# Patient Record
Sex: Male | Born: 1965 | Race: White | Hispanic: No | Marital: Married | Smoking: Former smoker
Health system: Southern US, Community
[De-identification: ages and names within clinical notes are randomized; demographics above are authoritative.]

## PROBLEM LIST (undated history)

## (undated) DIAGNOSIS — K219 Gastro-esophageal reflux disease without esophagitis: Secondary | ICD-10-CM

## (undated) DIAGNOSIS — I10 Essential (primary) hypertension: Secondary | ICD-10-CM

## (undated) HISTORY — PX: APPENDECTOMY: SHX54

---

## 2000-05-23 HISTORY — PX: HERNIA REPAIR: SHX51

## 2008-10-24 DIAGNOSIS — R040 Epistaxis: Secondary | ICD-10-CM | POA: Insufficient documentation

## 2011-07-05 LAB — BASIC METABOLIC PANEL
BUN: 18 mg/dL (ref 4–21)
Creatinine: 1.1 mg/dL (ref 0.6–1.3)
GLUCOSE: 89 mg/dL
POTASSIUM: 4.6 mmol/L (ref 3.4–5.3)
SODIUM: 139 mmol/L (ref 137–147)

## 2011-07-05 LAB — LIPID PANEL
Cholesterol: 168 mg/dL (ref 0–200)
HDL: 45 mg/dL (ref 35–70)
LDL Cholesterol: 98 mg/dL
TRIGLYCERIDES: 127 mg/dL (ref 40–160)

## 2011-07-05 LAB — TSH: TSH: 3.54 u[IU]/mL (ref 0.41–5.90)

## 2011-08-10 HISTORY — PX: UPPER GI ENDOSCOPY: SHX6162

## 2014-11-20 ENCOUNTER — Other Ambulatory Visit: Payer: Self-pay | Admitting: Family Medicine

## 2014-11-28 DIAGNOSIS — M25469 Effusion, unspecified knee: Secondary | ICD-10-CM | POA: Insufficient documentation

## 2014-12-03 ENCOUNTER — Other Ambulatory Visit: Payer: Self-pay | Admitting: Specialist

## 2014-12-03 DIAGNOSIS — M25462 Effusion, left knee: Secondary | ICD-10-CM

## 2014-12-09 ENCOUNTER — Ambulatory Visit
Admission: RE | Admit: 2014-12-09 | Discharge: 2014-12-09 | Disposition: A | Discharge status: Home / Self Care | Payer: BLUE CROSS/BLUE SHIELD | Source: Ambulatory Visit | Attending: Specialist | Admitting: Specialist

## 2014-12-09 DIAGNOSIS — S83242A Other tear of medial meniscus, current injury, left knee, initial encounter: Secondary | ICD-10-CM | POA: Diagnosis not present

## 2014-12-09 DIAGNOSIS — M25462 Effusion, left knee: Secondary | ICD-10-CM

## 2014-12-09 DIAGNOSIS — M1712 Unilateral primary osteoarthritis, left knee: Secondary | ICD-10-CM | POA: Insufficient documentation

## 2014-12-11 DIAGNOSIS — S83249A Other tear of medial meniscus, current injury, unspecified knee, initial encounter: Secondary | ICD-10-CM | POA: Insufficient documentation

## 2015-01-14 ENCOUNTER — Inpatient Hospital Stay: Admission: RE | Admit: 2015-01-14 | Payer: BLUE CROSS/BLUE SHIELD | Source: Ambulatory Visit

## 2015-01-19 ENCOUNTER — Encounter: Payer: Self-pay | Admitting: *Deleted

## 2015-01-19 NOTE — Patient Instructions (Signed)
  Your procedure is scheduled on: 01-21-15 Report to [place] To find out your arrival time please call [phone] between 1PM - 3PM on 01-20-15  Remember: Instructions that are not followed completely may result in serious medical risk, up to and including death, or upon the discretion of your surgeon and anesthesiologist your surgery may need to be rescheduled.    _X___ 1. Do not eat food or drink liquids after midnight. No gum chewing or hard candies.     _X___ 2. No Alcohol for 24 hours before or after surgery.   ____ 3. Bring all medications with you on the day of surgery if instructed.    ____ 4. Notify your doctor if there is any change in your medical condition     (cold, fever, infections).     Do not wear jewelry, make-up, hairpins, clips or nail polish.  Do not wear lotions, powders, or perfumes. You may wear deodorant.  Do not shave 48 hours prior to surgery. Men may shave face and neck.  Do not bring valuables to the hospital.    [facility] is not responsible for any belongings or valuables.               Contacts, dentures or bridgework may not be worn into surgery.  Leave your suitcase in the car. After surgery it may be brought to your room.  For patients admitted to the hospital, discharge time is determined by your treatment team.   Patients discharged the day of surgery will not be allowed to drive home.   Please read over the following fact sheets that you were given:     __X__ Take these medicines the morning of surgery with A SIP OF WATER:    1. PRILOSEC  2.   3.   4.  5.  6.  ____ Fleet Enema (as directed)   ____ Use CHG Soap as directed  ____ Use inhalers on the day of surgery  ____ Stop metformin 2 days prior to surgery    ____ Take 1/2 of usual insulin dose the night before surgery and none on the morning of surgery.   ____ Stop Coumadin/Plavix/aspirin-N/A  __X__ Stop Anti-inflammatories-STOP IBUPROFEN  NOW-NO NSAIDS OR ASA PRODUCTS-TYLENOL OK   ____ Stop supplements until after surgery.    ____ Bring C-Pap to the hospital.

## 2015-01-21 ENCOUNTER — Ambulatory Visit
Admission: RE | Admit: 2015-01-21 | Discharge: 2015-01-21 | Disposition: A | Discharge status: Home / Self Care | Payer: BLUE CROSS/BLUE SHIELD | Source: Ambulatory Visit | Attending: Specialist | Admitting: Specialist

## 2015-01-21 ENCOUNTER — Encounter
Admission: RE | Disposition: A | Discharge status: Home / Self Care | Payer: Self-pay | Source: Ambulatory Visit | Attending: Specialist

## 2015-01-21 ENCOUNTER — Ambulatory Visit: Payer: BLUE CROSS/BLUE SHIELD | Admitting: Certified Registered Nurse Anesthetist

## 2015-01-21 DIAGNOSIS — Y929 Unspecified place or not applicable: Secondary | ICD-10-CM | POA: Insufficient documentation

## 2015-01-21 DIAGNOSIS — Y998 Other external cause status: Secondary | ICD-10-CM | POA: Diagnosis not present

## 2015-01-21 DIAGNOSIS — K219 Gastro-esophageal reflux disease without esophagitis: Secondary | ICD-10-CM | POA: Diagnosis not present

## 2015-01-21 DIAGNOSIS — S83232A Complex tear of medial meniscus, current injury, left knee, initial encounter: Secondary | ICD-10-CM | POA: Diagnosis not present

## 2015-01-21 DIAGNOSIS — S83242A Other tear of medial meniscus, current injury, left knee, initial encounter: Secondary | ICD-10-CM | POA: Diagnosis present

## 2015-01-21 DIAGNOSIS — X58XXXA Exposure to other specified factors, initial encounter: Secondary | ICD-10-CM | POA: Diagnosis not present

## 2015-01-21 DIAGNOSIS — Y939 Activity, unspecified: Secondary | ICD-10-CM | POA: Insufficient documentation

## 2015-01-21 HISTORY — DX: Gastro-esophageal reflux disease without esophagitis: K21.9

## 2015-01-21 HISTORY — PX: KNEE ARTHROSCOPY: SHX127

## 2015-01-21 SURGERY — ARTHROSCOPY, KNEE
Anesthesia: General | Laterality: Left

## 2015-01-21 MED ORDER — PROPOFOL 10 MG/ML IV BOLUS
INTRAVENOUS | Status: DC | PRN
  Administered 2015-01-21: 20 mg via INTRAVENOUS
  Administered 2015-01-21: 180 mg via INTRAVENOUS

## 2015-01-21 MED ORDER — PHENYLEPHRINE HCL 10 MG/ML IJ SOLN
INTRAMUSCULAR | Status: DC | PRN
Start: 2015-01-21 — End: 2015-01-21
  Administered 2015-01-21: 150 ug via INTRAVENOUS
  Administered 2015-01-21 (×4): 100 ug via INTRAVENOUS

## 2015-01-21 MED ORDER — GABAPENTIN 400 MG PO CAPS
400.0000 mg | ORAL_CAPSULE | Freq: Once | ORAL | Status: AC
  Administered 2015-01-21: 400 mg via ORAL

## 2015-01-21 MED ORDER — MORPHINE SULFATE (PF) 4 MG/ML IV SOLN
INTRAVENOUS | Status: AC
  Filled 2015-01-21: qty 1

## 2015-01-21 MED ORDER — CEFAZOLIN SODIUM-DEXTROSE 2-3 GM-% IV SOLR
INTRAVENOUS | Status: AC
  Filled 2015-01-21: qty 50

## 2015-01-21 MED ORDER — FENTANYL CITRATE (PF) 100 MCG/2ML IJ SOLN
INTRAMUSCULAR | Status: DC | PRN
  Administered 2015-01-21: 50 ug via INTRAVENOUS
  Administered 2015-01-21: 25 ug via INTRAVENOUS

## 2015-01-21 MED ORDER — LACTATED RINGERS IV SOLN
INTRAVENOUS | Status: DC
  Administered 2015-01-21: 07:00:00 via INTRAVENOUS

## 2015-01-21 MED ORDER — MELOXICAM 15 MG PO TABS: 15.0000 mg | ORAL_TABLET | Freq: Every day | ORAL | Status: DC

## 2015-01-21 MED ORDER — ONDANSETRON HCL 4 MG/2ML IJ SOLN
INTRAMUSCULAR | Status: DC | PRN
  Administered 2015-01-21: 4 mg via INTRAVENOUS

## 2015-01-21 MED ORDER — GLYCOPYRROLATE 0.2 MG/ML IJ SOLN
INTRAMUSCULAR | Status: DC | PRN
  Administered 2015-01-21: 0.2 mg via INTRAVENOUS

## 2015-01-21 MED ORDER — PROMETHAZINE HCL 25 MG/ML IJ SOLN
6.2500 mg | INTRAMUSCULAR | Status: DC | PRN
Start: 2015-01-21 — End: 2015-01-21

## 2015-01-21 MED ORDER — GABAPENTIN 400 MG PO CAPS
ORAL_CAPSULE | ORAL | Status: AC
  Filled 2015-01-21: qty 1

## 2015-01-21 MED ORDER — FENTANYL CITRATE (PF) 100 MCG/2ML IJ SOLN
INTRAMUSCULAR | Status: AC
  Filled 2015-01-21: qty 2

## 2015-01-21 MED ORDER — MORPHINE SULFATE (PF) 4 MG/ML IV SOLN
INTRAVENOUS | Status: DC | PRN
  Administered 2015-01-21: 4 mg via INTRAMUSCULAR

## 2015-01-21 MED ORDER — MELOXICAM 7.5 MG PO TABS
15.0000 mg | ORAL_TABLET | Freq: Once | ORAL | Status: AC
  Administered 2015-01-21: 15 mg via ORAL

## 2015-01-21 MED ORDER — BUPIVACAINE-EPINEPHRINE (PF) 0.5% -1:200000 IJ SOLN
INTRAMUSCULAR | Status: AC
  Filled 2015-01-21: qty 60

## 2015-01-21 MED ORDER — MIDAZOLAM HCL 2 MG/2ML IJ SOLN
INTRAMUSCULAR | Status: DC | PRN
  Administered 2015-01-21: 2 mg via INTRAVENOUS

## 2015-01-21 MED ORDER — BUPIVACAINE-EPINEPHRINE (PF) 0.5% -1:200000 IJ SOLN
INTRAMUSCULAR | Status: DC | PRN
  Administered 2015-01-21: 30 mL

## 2015-01-21 MED ORDER — HYDROCODONE-ACETAMINOPHEN 5-325 MG PO TABS: 1.0000 | ORAL_TABLET | Freq: Four times a day (QID) | ORAL | Status: DC | PRN

## 2015-01-21 MED ORDER — FENTANYL CITRATE (PF) 100 MCG/2ML IJ SOLN
25.0000 ug | INTRAMUSCULAR | Status: DC | PRN
  Administered 2015-01-21 (×4): 25 ug via INTRAVENOUS

## 2015-01-21 MED ORDER — ACETAMINOPHEN 10 MG/ML IV SOLN
INTRAVENOUS | Status: AC
  Filled 2015-01-21: qty 100

## 2015-01-21 MED ORDER — LIDOCAINE HCL (CARDIAC) 20 MG/ML IV SOLN
INTRAVENOUS | Status: DC | PRN
  Administered 2015-01-21: 60 mg via INTRAVENOUS

## 2015-01-21 MED ORDER — CEFAZOLIN SODIUM-DEXTROSE 2-3 GM-% IV SOLR
2.0000 g | Freq: Once | INTRAVENOUS | Status: AC
  Administered 2015-01-21: 2 g via INTRAVENOUS

## 2015-01-21 MED ORDER — MELOXICAM 7.5 MG PO TABS
ORAL_TABLET | ORAL | Status: AC
  Administered 2015-01-21: 15 mg via ORAL
  Filled 2015-01-21: qty 2

## 2015-01-21 MED ORDER — GABAPENTIN 400 MG PO CAPS: 400.0000 mg | ORAL_CAPSULE | Freq: Three times a day (TID) | ORAL | Status: DC

## 2015-01-21 MED ORDER — ACETAMINOPHEN 10 MG/ML IV SOLN
INTRAVENOUS | Status: DC | PRN
  Administered 2015-01-21: 1000 mg via INTRAVENOUS

## 2015-01-21 SURGICAL SUPPLY — 22 items
BAG COUNTER SPONGE EZ (MISCELLANEOUS) IMPLANT
BLADE AGGRESSIVE PLUS 4.0 (BLADE) ×2 IMPLANT
BUR RADIUS 4.0X18.5 (BURR) ×2 IMPLANT
CHLORAPREP W/TINT 26ML (MISCELLANEOUS) ×2 IMPLANT
CUTTER SLOTTED WHISKER 4.0 (BURR) ×2 IMPLANT
GAUZE SPONGE 4X4 12PLY STRL (GAUZE/BANDAGES/DRESSINGS) ×2 IMPLANT
GLOVE BIO SURGEON STRL SZ8 (GLOVE) ×2 IMPLANT
GOWN STRL REUS W/ TWL LRG LVL3 (GOWN DISPOSABLE) ×2 IMPLANT
GOWN STRL REUS W/TWL LRG LVL3 (GOWN DISPOSABLE) ×2
IV LACTATED RINGER IRRG 3000ML (IV SOLUTION) ×6
IV LR IRRIG 3000ML ARTHROMATIC (IV SOLUTION) ×6 IMPLANT
KIT RM TURNOVER STRD PROC AR (KITS) ×2 IMPLANT
MANIFOLD NEPTUNE II (INSTRUMENTS) ×2 IMPLANT
NDL SAFETY 18GX1.5 (NEEDLE) ×4 IMPLANT
PACK ARTHROSCOPY KNEE (MISCELLANEOUS) ×2 IMPLANT
SET TUBE SUCT SHAVER OUTFL 24K (TUBING) ×2 IMPLANT
SOL PREP PVP 2OZ (MISCELLANEOUS) ×2
SOLUTION PREP PVP 2OZ (MISCELLANEOUS) ×1 IMPLANT
SUT ETHILON 3 0 FSLX (SUTURE) ×2 IMPLANT
SYR 30ML LL (SYRINGE) ×4 IMPLANT
TUBING ARTHRO INFLOW-ONLY STRL (TUBING) ×2 IMPLANT
WAND HAND CNTRL MULTIVAC 50 (MISCELLANEOUS) ×2 IMPLANT

## 2015-01-21 NOTE — Op Note (Signed)
01/21/2015  9:06 AM  PATIENT:  Scott Obrien    PRE-OPERATIVE DIAGNOSIS:  LEFT KNEE MEDIAL MENISCUS TEAR  POST-OPERATIVE DIAGNOSIS:  Same  PROCEDURE:  ARTHROSCOPIC PARTIAL LEFT MEDIAL MENISECTOMY  SURGEON:  Eliseo Withers E, MD  COMPLICATIONS:   None  EBL:  Minimal  TOURNIQUET TIME:   None  ANESTHESIA:  Gen. LMA  PREOPERATIVE INDICATIONS:  Scott Obrien is a  49 y.o. male with a diagnosis of LEFT KNEE PAIN who failed conservative measures and elected for surgical management.  MRI indicated a complex tear of the posterior horn of the left medial meniscus  The risks benefits and alternatives were discussed with the patient preoperatively including but not limited to the risks of infection, bleeding, nerve injury, cardiopulmonary complications, the need for revision surgery, among others, and the patient was willing to proceed.  OPERATIVE IMPLANTS: None  OPERATIVE FINDINGS: Complex tear posterior horn left medial meniscus.  Suprapatellar pouch and gutters clear.  Articular surfaces cleaned medially and laterally.  Minimal fraying on patella.  Lateral joint compartment normal with intact lateral meniscus.  Anterior and posterior cruciates intact.  Moderate anterior synovitis.  OPERATIVE PROCEDURE: The patient was brought to the operating room and underwent satisfactory general LMA anesthesia in the supine position.  The leg was prepped and draped in a sterile fashion.  Arthroscopy was carried out through standard portals.  The above findings were encountered upon arthroscopy.  The medial meniscus was then debrided with basket forceps, ArthroCare wand, and motorized resectors.  A stable rim was achieved.  There had been anterior and posterior flaps, which were moving in and out of the joint.  Once this was completed and stabilized the joint was thoroughly irrigated.  Instruments were removed and knee wounds closed with 3-0 nylon.  Sponge and needle counts were correct. A dry sterile  dressing was applied.  Patient was awakened and taken to recovery in good condition.   Park Breed, MD

## 2015-01-21 NOTE — Discharge Instructions (Signed)
PARTIAL WEIGHT ON LEG  CHANGE DRESSING IN 48 HRS.  AGGRESSIVE MOTION OF KNEE  ICE FOR SEVERAL DAYSAMBULATORY SURGERY  DISCHARGE INSTRUCTIONS   1) The drugs that you were given will stay in your system until tomorrow so for the next 24 hours you should not:  A) Drive an automobile B) Make any legal decisions C) Drink any alcoholic beverage   2) You may resume regular meals tomorrow.  Today it is better to start with liquids and gradually work up to solid foods.  You may eat anything you prefer, but it is better to start with liquids, then soup and crackers, and gradually work up to solid foods.   3) Please notify your doctor immediately if you have any unusual bleeding, trouble breathing, redness and pain at the surgery site, drainage, fever, or pain not relieved by medication.    4) Additional Instructions:        Please contact your physician with any problems or Same Day Surgery at [phone], Monday through Friday 6 am to 4 pm, or [place] at [facility] number at [phone].

## 2015-01-21 NOTE — Transfer of Care (Signed)
Immediate Anesthesia Transfer of Care Note  Patient: Scott Obrien  Procedure(s) Performed: Procedure(s): ARTHROSCOPY KNEE (Left)  Patient Location: PACU  Anesthesia Type:General  Level of Consciousness: sedated  Airway & Oxygen Therapy: Patient Spontanous Breathing and Patient connected to face mask oxygen  Post-op Assessment: Report given to RN and Post -op Vital signs reviewed and stable  Post vital signs: Reviewed and stable  Last Vitals:  Filed Vitals:   01/21/15 0906  BP:   Pulse:   Temp: 36.2 C  Resp:   0905 VS BP 109/59 HR 73 Resp 13 Temp 92.4  Complications: No apparent anesthesia complications

## 2015-01-21 NOTE — H&P (Signed)
THE PATIENT WAS SEEN IN THE HOLDING AREA.  HISTORY, ALLERGIES, HOME MEDICATIONS AND OPERATIVE PROCEDURE WERE REVIEWED. RISKS AND BENEFITS OF SURGERY DISCUSSED WITH PATIENT AGAIN.  NO CHANGES FROM INITIAL HISTORY AND PHYSICAL NOTED.    

## 2015-01-21 NOTE — Anesthesia Postprocedure Evaluation (Signed)
  Anesthesia Post-op Note  Patient: Scott Obrien  Procedure(s) Performed: Procedure(s): ARTHROSCOPY KNEE (Left)  Anesthesia type:General  Patient location: PACU  Post pain: Pain level controlled  Post assessment: Post-op Vital signs reviewed, Patient's Cardiovascular Status Stable, Respiratory Function Stable, Patent Airway and No signs of Nausea or vomiting  Post vital signs: Reviewed and stable  Last Vitals:  Filed Vitals:   01/21/15 1051  BP: 145/103  Pulse: 64  Temp:   Resp: 16    Level of consciousness: awake, alert  and patient cooperative  Complications: No apparent anesthesia complications

## 2015-01-21 NOTE — Anesthesia Preprocedure Evaluation (Signed)
Anesthesia Evaluation  Patient identified by MRN, date of birth, ID band Patient awake    Reviewed: Allergy & Precautions, H&P , NPO status , Patient's Chart, lab work & pertinent test results, reviewed documented beta blocker date and time   History of Anesthesia Complications Negative for: history of anesthetic complications  Airway Mallampati: II  TM Distance: >3 FB Neck ROM: full    Dental no notable dental hx. (+) Teeth Intact   Pulmonary neg pulmonary ROS,  breath sounds clear to auscultation  Pulmonary exam normal       Cardiovascular Exercise Tolerance: Good hypertension (not diagnosed, but likely based on todays pressures and prior history of borderline HTN), Normal cardiovascular examRhythm:regular Rate:Normal     Neuro/Psych negative neurological ROS  negative psych ROS   GI/Hepatic Neg liver ROS, GERD-  Medicated and Controlled,  Endo/Other  negative endocrine ROS  Renal/GU negative Renal ROS  negative genitourinary   Musculoskeletal   Abdominal   Peds  Hematology negative hematology ROS (+)   Anesthesia Other Findings Past Medical History:   GERD (gastroesophageal reflux disease)                       Reproductive/Obstetrics negative OB ROS                             Anesthesia Physical Anesthesia Plan  ASA: II  Anesthesia Plan: General   Post-op Pain Management:    Induction:   Airway Management Planned:   Additional Equipment:   Intra-op Plan:   Post-operative Plan:   Informed Consent: I have reviewed the patients History and Physical, chart, labs and discussed the procedure including the risks, benefits and alternatives for the proposed anesthesia with the patient or authorized representative who has indicated his/her understanding and acceptance.   Dental Advisory Given  Plan Discussed with: Anesthesiologist, CRNA and Surgeon  Anesthesia Plan Comments:          Anesthesia Quick Evaluation

## 2015-09-28 DIAGNOSIS — IMO0001 Reserved for inherently not codable concepts without codable children: Secondary | ICD-10-CM | POA: Insufficient documentation

## 2015-09-28 DIAGNOSIS — R1310 Dysphagia, unspecified: Secondary | ICD-10-CM

## 2015-09-28 DIAGNOSIS — R030 Elevated blood-pressure reading, without diagnosis of hypertension: Principal | ICD-10-CM

## 2015-09-30 ENCOUNTER — Encounter: Payer: Self-pay | Admitting: Family Medicine

## 2015-09-30 ENCOUNTER — Ambulatory Visit (INDEPENDENT_AMBULATORY_CARE_PROVIDER_SITE_OTHER): Payer: BLUE CROSS/BLUE SHIELD | Admitting: Family Medicine

## 2015-09-30 VITALS — BP 140/100 | HR 65 | Temp 97.7°F | Resp 16 | Ht 72.0 in | Wt 195.0 lb

## 2015-09-30 DIAGNOSIS — K219 Gastro-esophageal reflux disease without esophagitis: Secondary | ICD-10-CM

## 2015-09-30 DIAGNOSIS — Z13220 Encounter for screening for lipoid disorders: Secondary | ICD-10-CM | POA: Diagnosis not present

## 2015-09-30 DIAGNOSIS — R030 Elevated blood-pressure reading, without diagnosis of hypertension: Secondary | ICD-10-CM

## 2015-09-30 DIAGNOSIS — IMO0001 Reserved for inherently not codable concepts without codable children: Secondary | ICD-10-CM

## 2015-09-30 MED ORDER — RANITIDINE HCL 300 MG PO TABS: 300.0000 mg | ORAL_TABLET | Freq: Every day | ORAL | Status: DC

## 2015-09-30 NOTE — Progress Notes (Signed)
Patient: Scott Obrien Male    DOB: 25-Jul-1965   50 y.o.   MRN: [id number] Visit Date: 09/30/2015  Today's Provider: Lelon Huh, MD   Chief Complaint  Patient presents with  . Gastroesophageal Reflux    follow up  . Blood Pressure Check    follow up   Subjective:    HPI Follow up Reflux: Last office visit was several years ago. Current treatment includes Omeprazole 20mg  daily. Patient reports good compliance with treatment, good tolerance and good symptom control. He only takes it when he gets heart burn or indigestion, which is about every other day. He was noted to have moderately elevated BP when he knee surgery last fall and was advised to follow up with PCP.    Elevated blood pressure, follow-up:  BP Readings from Last 3 Encounters:  09/30/15 138/90  01/21/15 145/103    He was last seen for elevated blood pressure 4 years ago.  BP at that visit was 138/88. Management since that visit includes starting Aspirin 81mg  daily and advising patient to increase exercise and modify diet. He reports fair compliance with treatment. Patient did not start taking a daily Aspirin.  He is not having side effects.  He is exercising. Mostly pushups and sit ups.  He is adherent to low salt diet.   Outside blood pressures are 140/90. He is experiencing none.  Patient denies chest pain, chest pressure/discomfort, claudication, dyspnea, exertional chest pressure/discomfort, fatigue, irregular heart beat, lower extremity edema, near-syncope, orthopnea, palpitations, paroxysmal nocturnal dyspnea, syncope and tachypnea.   Cardiovascular risk factors include male gender.  Use of agents associated with hypertension: none.     Weight trend: increasing steadily Wt Readings from Last 3 Encounters:  09/30/15 195 lb (88.451 kg)  01/21/15 185 lb (83.915 kg)  12/09/14 190 lb (86.183 kg)    Current diet: well  balanced  ------------------------------------------------------------------------      No Known Allergies Previous Medications   IBUPROFEN (ADVIL,MOTRIN) 200 MG TABLET    Take 200 mg by mouth every 6 (six) hours as needed.   OMEPRAZOLE (PRILOSEC) 20 MG CAPSULE    TAKE ONE CAPSULE EVERY MORNING USUALLY 30 MINUTES BEFORE BREAKFAST    Review of Systems  Constitutional: Negative for fever, chills and appetite change.  Respiratory: Negative for chest tightness, shortness of breath and wheezing.   Cardiovascular: Negative for chest pain and palpitations.  Gastrointestinal: Negative for nausea, vomiting and abdominal pain.    Social History  Substance Use Topics  . Smoking status: Former Research scientist (life sciences)  . Smokeless tobacco: Not on file  . Alcohol Use: No   Objective:     Filed Vitals:   09/30/15 0802 09/30/15 0833  BP: 138/90 140/100  Pulse: 65   Temp: 97.7 F (36.5 C)   TempSrc: Oral   Resp: 16   Height: 6' (1.829 m)   Weight: 195 lb (88.451 kg)   SpO2: 94%     Physical Exam   General Appearance:    Alert, cooperative, no distress  Eyes:    PERRL, conjunctiva/corneas clear, EOM's intact       Lungs:     Clear to auscultation bilaterally, respirations unlabored  Heart:    Regular rate and rhythm  Neurologic:   Awake, alert, oriented x 3. No apparent focal neurological           defect.      EKG: Sinus bradycardia.      Assessment & Plan:  1. Elevated blood pressure Given information about DASH diet - EKG 12-Lead - Renal function panel - Lipid panel  Recheck BP at CPE in 3 months. Start meds if not improved.   2. Gastroesophageal reflux disease, esophagitis presence not specified He is concerned about long term consequences of PPI. Will try 300mg  ranitidine instead   3. Lipid screening  - Lipid panel       Lelon Huh, MD  [facility]

## 2015-09-30 NOTE — Patient Instructions (Signed)
DASH Eating Plan  DASH stands for "Dietary Approaches to Stop Hypertension." The DASH eating plan is a healthy eating plan that has been shown to reduce high blood pressure (hypertension). Additional health benefits may include reducing the risk of type 2 diabetes mellitus, heart disease, and stroke. The DASH eating plan may also help with weight loss.  WHAT DO I NEED TO KNOW ABOUT THE DASH EATING PLAN?  For the DASH eating plan, you will follow these general guidelines:  · Choose foods with a percent daily value for sodium of less than 5% (as listed on the food label).  · Use salt-free seasonings or herbs instead of table salt or sea salt.  · Check with your health care provider or pharmacist before using salt substitutes.  · Eat lower-sodium products, often labeled as "lower sodium" or "no salt added."  · Eat fresh foods.  · Eat more vegetables, fruits, and low-fat dairy products.  · Choose whole grains. Look for the word "whole" as the first word in the ingredient list.  · Choose fish and skinless chicken or turkey more often than red meat. Limit fish, poultry, and meat to 6 oz (170 g) each day.  · Limit sweets, desserts, sugars, and sugary drinks.  · Choose heart-healthy fats.  · Limit cheese to 1 oz (28 g) per day.  · Eat more home-cooked food and less restaurant, buffet, and fast food.  · Limit fried foods.  · Cook foods using methods other than frying.  · Limit canned vegetables. If you do use them, rinse them well to decrease the sodium.  · When eating at a restaurant, ask that your food be prepared with less salt, or no salt if possible.  WHAT FOODS CAN I EAT?  Seek help from a dietitian for individual calorie needs.  Grains  Whole grain or whole wheat bread. Brown rice. Whole grain or whole wheat pasta. Quinoa, bulgur, and whole grain cereals. Low-sodium cereals. Corn or whole wheat flour tortillas. Whole grain cornbread. Whole grain crackers. Low-sodium crackers.  Vegetables  Fresh or frozen vegetables  (raw, steamed, roasted, or grilled). Low-sodium or reduced-sodium tomato and vegetable juices. Low-sodium or reduced-sodium tomato sauce and paste. Low-sodium or reduced-sodium canned vegetables.   Fruits  All fresh, canned (in natural juice), or frozen fruits.  Meat and Other Protein Products  Ground beef (85% or leaner), grass-fed beef, or beef trimmed of fat. Skinless chicken or turkey. Ground chicken or turkey. Pork trimmed of fat. All fish and seafood. Eggs. Dried beans, peas, or lentils. Unsalted nuts and seeds. Unsalted canned beans.  Dairy  Low-fat dairy products, such as skim or 1% milk, 2% or reduced-fat cheeses, low-fat ricotta or cottage cheese, or plain low-fat yogurt. Low-sodium or reduced-sodium cheeses.  Fats and Oils  Tub margarines without trans fats. Light or reduced-fat mayonnaise and salad dressings (reduced sodium). Avocado. Safflower, olive, or canola oils. Natural peanut or almond butter.  Other  Unsalted popcorn and pretzels.  The items listed above may not be a complete list of recommended foods or beverages. Contact your dietitian for more options.  WHAT FOODS ARE NOT RECOMMENDED?  Grains  White bread. White pasta. White rice. Refined cornbread. Bagels and croissants. Crackers that contain trans fat.  Vegetables  Creamed or fried vegetables. Vegetables in a cheese sauce. Regular canned vegetables. Regular canned tomato sauce and paste. Regular tomato and vegetable juices.  Fruits  Dried fruits. Canned fruit in light or heavy syrup. Fruit juice.  Meat and Other Protein   Products  Fatty cuts of meat. Ribs, chicken wings, bacon, sausage, bologna, salami, chitterlings, fatback, hot dogs, bratwurst, and packaged luncheon meats. Salted nuts and seeds. Canned beans with salt.  Dairy  Whole or 2% milk, cream, half-and-half, and cream cheese. Whole-fat or sweetened yogurt. Full-fat cheeses or blue cheese. Nondairy creamers and whipped toppings. Processed cheese, cheese spreads, or cheese  curds.  Condiments  Onion and garlic salt, seasoned salt, table salt, and sea salt. Canned and packaged gravies. Worcestershire sauce. Tartar sauce. Barbecue sauce. Teriyaki sauce. Soy sauce, including reduced sodium. Steak sauce. Fish sauce. Oyster sauce. Cocktail sauce. Horseradish. Ketchup and mustard. Meat flavorings and tenderizers. Bouillon cubes. Hot sauce. Tabasco sauce. Marinades. Taco seasonings. Relishes.  Fats and Oils  Butter, stick margarine, lard, shortening, ghee, and bacon fat. Coconut, palm kernel, or palm oils. Regular salad dressings.  Other  Pickles and olives. Salted popcorn and pretzels.  The items listed above may not be a complete list of foods and beverages to avoid. Contact your dietitian for more information.  WHERE CAN I FIND MORE INFORMATION?  National Heart, Lung, and [employer] Institute: www.nhlbi.nih.gov/health/health-topics/topics/dash/     This information is not intended to replace advice given to you by your health care provider. Make sure you discuss any questions you have with your health care provider.     Document Released: 04/28/2011 Document Revised: 05/30/2014 Document Reviewed: 03/13/2013  [employer]vier Interactive Patient Education ©2016 [employer]

## 2015-10-01 LAB — RENAL FUNCTION PANEL
Albumin: 4.4 g/dL (ref 3.5–5.5)
BUN / CREAT RATIO: 17 (ref 9–20)
BUN: 19 mg/dL (ref 6–24)
CO2: 23 mmol/L (ref 18–29)
CREATININE: 1.1 mg/dL (ref 0.76–1.27)
Calcium: 9.4 mg/dL (ref 8.7–10.2)
Chloride: 101 mmol/L (ref 96–106)
GFR calc Af Amer: 91 mL/min/{1.73_m2} (ref >59)
GFR, EST NON AFRICAN AMERICAN: 78 mL/min/{1.73_m2} (ref >59)
Glucose: 85 mg/dL (ref 65–99)
PHOSPHORUS: 3.2 mg/dL (ref 2.5–4.5)
Potassium: 4.8 mmol/L (ref 3.5–5.2)
SODIUM: 141 mmol/L (ref 134–144)

## 2015-10-01 LAB — LIPID PANEL
CHOL/HDL RATIO: 4.9 ratio (ref 0.0–5.0)
Cholesterol, Total: 182 mg/dL (ref 100–199)
HDL: 37 mg/dL — ABNORMAL LOW (ref >39)
LDL CALC: 109 mg/dL — AB (ref 0–99)
TRIGLYCERIDES: 179 mg/dL — AB (ref 0–149)
VLDL Cholesterol Cal: 36 mg/dL (ref 5–40)

## 2016-01-08 ENCOUNTER — Encounter: Payer: Self-pay | Admitting: Family Medicine

## 2016-01-08 ENCOUNTER — Ambulatory Visit (INDEPENDENT_AMBULATORY_CARE_PROVIDER_SITE_OTHER): Payer: BLUE CROSS/BLUE SHIELD | Admitting: Family Medicine

## 2016-01-08 VITALS — BP 140/84 | HR 71 | Temp 97.9°F | Resp 16 | Ht 72.0 in | Wt 188.0 lb

## 2016-01-08 DIAGNOSIS — Z0000 Encounter for general adult medical examination without abnormal findings: Secondary | ICD-10-CM | POA: Diagnosis not present

## 2016-01-08 DIAGNOSIS — L918 Other hypertrophic disorders of the skin: Secondary | ICD-10-CM | POA: Diagnosis not present

## 2016-01-08 DIAGNOSIS — Z1211 Encounter for screening for malignant neoplasm of colon: Secondary | ICD-10-CM

## 2016-01-08 NOTE — Progress Notes (Signed)
Patient: Scott Obrien, Male    DOB: 1965-07-17, 50 y.o.   MRN: [id number] Visit Date: 01/08/2016  Today's Provider: Lelon Huh, MD   Chief Complaint  Patient presents with  . Annual Exam  . Gastroesophageal Reflux  . Hypertension   Subjective:    Annual physical exam Scott Obrien is a 50 y.o. male who presents today for health maintenance and complete physical. He feels well. He reports exercising yes. He reports he is sleeping well. He has small skin tag on left jaw tht he would like removed.   ---------------------------------------------------------------- Lab Results  Component Value Date   CHOL 182 09/30/2015   HDL 37 (L) 09/30/2015   LDLCALC 109 (H) 09/30/2015   TRIG 179 (H) 09/30/2015   CHOLHDL 4.9 09/30/2015     Elevated blood pressure: From 09/30/2015-advised to work on diet and exercise. Given information about DASH diet. Wt Readings from Last 3 Encounters:  01/08/16 188 lb (85.3 kg)  09/30/15 195 lb (88.5 kg)  01/21/15 185 lb (83.9 kg)   BMP Latest Ref Rng & Units 09/30/2015 07/05/2011  Glucose 65 - 99 mg/dL 85 -  BUN 6 - 24 mg/dL 19 18  Creatinine 0.76 - 1.27 mg/dL 1.10 1.1  BUN/Creat Ratio 9 - 20 17 -  Sodium 134 - 144 mmol/L 141 139  Potassium 3.5 - 5.2 mmol/L 4.8 4.6  Chloride 96 - 106 mmol/L 101 -  CO2 18 - 29 mmol/L 23 -  Calcium 8.7 - 10.2 mg/dL 9.4 -     Gastroesophageal reflux disease, esophagitis presence not specified: From 09/30/2015-advised to try 300mg  ranitidine in place of PPI. States ranitidine is working well.   Review of Systems  Constitutional: Negative for appetite change, chills and fever.  HENT: Negative.   Eyes: Negative.   Respiratory: Negative for chest tightness, shortness of breath and wheezing.   Cardiovascular: Negative for chest pain and palpitations.  Gastrointestinal: Negative for abdominal pain, nausea and vomiting.  Endocrine: Negative.   Genitourinary: Negative.   Musculoskeletal: Negative.     Allergic/Immunologic: Negative.   Hematological: Negative.   Psychiatric/Behavioral: Positive for decreased concentration.    Social History      He  reports that he has quit smoking. He does not have any smokeless tobacco history on file. He reports that he does not drink alcohol or use drugs.       Social History   Social History  . Marital status: Married    Spouse name: N/A  . Number of children: 4  . Years of education: N/A   Occupational History  . 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 Smoking status: Former Research scientist (life sciences)  . Smokeless tobacco: None     Comment: Smoked occasionally, but quit at age 76  . Alcohol use No  . Drug use: No  . Sexual activity: Not Asked   Other Topics Concern  . None   Social History Narrative  . None    Past Medical History:  Diagnosis Date  . GERD (gastroesophageal reflux disease)      Patient Active Problem List   Diagnosis Date Noted  . GERD (gastroesophageal reflux disease) 09/30/2015  . Elevated blood pressure 09/28/2015  . Dysphagia 09/28/2015    Past Surgical History:  Procedure Laterality Date  . APPENDECTOMY    . HERNIA REPAIR  2002  . KNEE ARTHROSCOPY Left 01/21/2015   Procedure: ARTHROSCOPY KNEE;  Surgeon: Nadara Mustard  Sabra Heck, MD;  Location: [facility] ORS;  Service: Orthopedics;  Laterality: Left;  . UPPER GI ENDOSCOPY  08/10/2011   Dr. Dionne Milo, Chronic inactive gastritis. No untestinal metapasia. No H. Pylori. Normal esophagus    Family History        Family Status  Relation Status  . Mother Alive  . Father Deceased at age 34   Aorta rupture  . Sister Alive  . Maternal Grandmother Deceased at age 20  . Maternal Grandfather Deceased at age 37  . Paternal Grandmother Deceased at age 60  . Paternal Grandfather Deceased at age 65  . Other Alive        His family history includes Diverticulitis in his mother; Heart disease in his other; Hodgkin's lymphoma in his paternal grandfather;  Stroke in his maternal grandmother.    No Known Allergies  Current Meds  Medication Sig  . ibuprofen (ADVIL,MOTRIN) 200 MG tablet Take 200 mg by mouth every 6 (six) hours as needed.  . ranitidine (ZANTAC) 300 MG tablet Take 1 tablet (300 mg total) by mouth daily.    Patient Care Team: Birdie Sons, MD as PCP - General (Family Medicine)     Objective:   Vitals: BP 140/84 (BP Location: Right Arm, Patient Position: Sitting, Cuff Size: Large)   Pulse 71   Temp 97.9 F (36.6 C) (Oral)   Resp 16   Ht 6' (1.829 m)   Wt 188 lb (85.3 kg)   SpO2 97%   BMI 25.50 kg/m    Physical Exam   General Appearance:    Alert, cooperative, no distress, appears stated age  Head:    Normocephalic, without obvious abnormality, atraumatic  Eyes:    PERRL, conjunctiva/corneas clear, EOM's intact, fundi    benign, both eyes       Ears:    Normal TM's and external ear canals, both ears  Nose:   Nares normal, septum midline, mucosa normal, no drainage   or sinus tenderness  Throat:   Lips, mucosa, and tongue normal; teeth and gums normal  Neck:   Supple, symmetrical, trachea midline, no adenopathy;       thyroid:  No enlargement/tenderness/nodules; no carotid   bruit or JVD  Back:     Symmetric, no curvature, ROM normal, no CVA tenderness  Lungs:     Clear to auscultation bilaterally, respirations unlabored  Chest wall:    No tenderness or deformity  Heart:    Regular rate and rhythm, S1 and S2 normal, no murmur, rub   or gallop  Abdomen:     Soft, non-tender, bowel sounds active all four quadrants,    no masses, no organomegaly  Genitalia:    deferred  Rectal:    normal tone, normal prostate, no masses or tenderness  Extremities:   Extremities normal, atraumatic, no cyanosis or edema  Pulses:   2+ and symmetric all extremities  Skin:   Skin color, texture, turgor normal, no rashes. About 3x70mm skin take left side of jawline  Lymph nodes:   Cervical, supraclavicular, and axillary nodes  normal  Neurologic:   CNII-XII intact. Normal strength, sensation and reflexes      throughout    Depression Screen PHQ 2/9 Scores 01/08/2016  PHQ - 2 Score 0  PHQ- 9 Score 2      Assessment & Plan:     Routine Health Maintenance and Physical Exam  Exercise Activities and Dietary recommendations Goals    None      Immunization History  Administered Date(s) Administered  . Tdap 02/21/2007    Health Maintenance  Topic Date Due  . HIV Screening  10/24/1980  . COLONOSCOPY  10/25/2015  . INFLUENZA VACCINE  12/22/2015  . TETANUS/TDAP  02/20/2017      Discussed health benefits of physical activity, and encouraged him to engage in regular exercise appropriate for his age and condition.    --------------------------------------------------------------------  1. Annual physical exam Doing well. BP and weight better with improvement in diet.  - EKG 12-Lead  2. Colon cancer screening  - Ambulatory referral to Gastroenterology  3. Skin tag Prepped area with isopropyl alcohol, injected with -.15ml 2% lidocaine with epinephrine and excised lesion with sterile tissue scissors. Patient tolerated well.    Lelon Huh, MD  [facility]

## 2016-01-15 ENCOUNTER — Other Ambulatory Visit: Payer: Self-pay

## 2016-01-15 DIAGNOSIS — Z8669 Personal history of other diseases of the nervous system and sense organs: Secondary | ICD-10-CM | POA: Insufficient documentation

## 2016-01-15 DIAGNOSIS — I10 Essential (primary) hypertension: Secondary | ICD-10-CM | POA: Insufficient documentation

## 2016-01-15 DIAGNOSIS — J111 Influenza due to unidentified influenza virus with other respiratory manifestations: Secondary | ICD-10-CM | POA: Insufficient documentation

## 2016-01-20 ENCOUNTER — Telehealth: Payer: Self-pay

## 2016-01-20 NOTE — Telephone Encounter (Signed)
Patient needs to be scheduled for a screening colonoscopy. Called two times, and sent out a notification letter. Please update referral once procedure has been scheduled.

## 2016-02-11 ENCOUNTER — Telehealth: Payer: Self-pay

## 2016-02-11 ENCOUNTER — Other Ambulatory Visit: Payer: Self-pay

## 2016-02-11 DIAGNOSIS — Z1211 Encounter for screening for malignant neoplasm of colon: Secondary | ICD-10-CM

## 2016-02-11 MED ORDER — NA SULFATE-K SULFATE-MG SULF 17.5-3.13-1.6 GM/177ML PO SOLN: 1.0000 | ORAL | 0 refills | Status: DC

## 2016-02-11 NOTE — Telephone Encounter (Signed)
Gastroenterology Pre-Procedure Review  Request Date: 03/11/16 Requesting Physician: Dr. Allen Norris  PATIENT REVIEW QUESTIONS: The patient responded to the following health history questions as indicated:    1. Are you having any GI issues? no 2. Do you have a personal history of Polyps? no 3. Do you have a family history of Colon Cancer or Polyps? no 4. Diabetes Mellitus? no 5. Joint replacements in the past 12 months?no 6. Major health problems in the past 3 months?no 7. Any artificial heart valves, MVP, or defibrillator?no    MEDICATIONS & ALLERGIES:    Patient reports the following regarding taking any anticoagulation/antiplatelet therapy:   Plavix, Coumadin, Eliquis, Xarelto, Lovenox, Pradaxa, Brilinta, or Effient? no Aspirin? no  Patient confirms/reports the following medications:  Current Outpatient Prescriptions  Medication Sig Dispense Refill  . ibuprofen (ADVIL,MOTRIN) 200 MG tablet Take 200 mg by mouth every 6 (six) hours as needed.    . ranitidine (ZANTAC) 300 MG tablet Take 1 tablet (300 mg total) by mouth daily. 30 tablet 12   No current facility-administered medications for this visit.     Patient confirms/reports the following allergies:  No Known Allergies  No orders of the defined types were placed in this encounter.   AUTHORIZATION INFORMATION Primary Insurance: 1D#: Group #:  Secondary Insurance: 1D#: Group #:  SCHEDULE INFORMATION: Date: 03/11/16  Time: Location: [place]

## 2016-03-07 ENCOUNTER — Encounter: Payer: Self-pay | Admitting: *Deleted

## 2016-03-08 ENCOUNTER — Other Ambulatory Visit: Payer: Self-pay

## 2016-03-08 MED ORDER — NA SULFATE-K SULFATE-MG SULF 17.5-3.13-1.6 GM/177ML PO SOLN: 1.0000 | ORAL | 0 refills | Status: DC

## 2016-03-08 NOTE — Discharge Instructions (Signed)

## 2016-03-11 ENCOUNTER — Ambulatory Visit: Payer: BLUE CROSS/BLUE SHIELD | Admitting: Anesthesiology

## 2016-03-11 ENCOUNTER — Ambulatory Visit
Admission: RE | Admit: 2016-03-11 | Discharge: 2016-03-11 | Disposition: A | Discharge status: Home / Self Care | Payer: BLUE CROSS/BLUE SHIELD | Source: Ambulatory Visit | Attending: Gastroenterology | Admitting: Gastroenterology

## 2016-03-11 ENCOUNTER — Encounter
Admission: RE | Disposition: A | Discharge status: Home / Self Care | Payer: Self-pay | Source: Ambulatory Visit | Attending: Gastroenterology

## 2016-03-11 DIAGNOSIS — Z87891 Personal history of nicotine dependence: Secondary | ICD-10-CM | POA: Insufficient documentation

## 2016-03-11 DIAGNOSIS — Z1211 Encounter for screening for malignant neoplasm of colon: Secondary | ICD-10-CM | POA: Diagnosis not present

## 2016-03-11 DIAGNOSIS — D123 Benign neoplasm of transverse colon: Secondary | ICD-10-CM | POA: Diagnosis not present

## 2016-03-11 DIAGNOSIS — K219 Gastro-esophageal reflux disease without esophagitis: Secondary | ICD-10-CM | POA: Insufficient documentation

## 2016-03-11 DIAGNOSIS — K648 Other hemorrhoids: Secondary | ICD-10-CM | POA: Insufficient documentation

## 2016-03-11 HISTORY — PX: POLYPECTOMY: SHX5525

## 2016-03-11 HISTORY — PX: COLONOSCOPY WITH PROPOFOL: SHX5780

## 2016-03-11 SURGERY — COLONOSCOPY WITH PROPOFOL
Anesthesia: Monitor Anesthesia Care | Wound class: Contaminated

## 2016-03-11 MED ORDER — STERILE WATER FOR IRRIGATION IR SOLN
Status: DC | PRN
  Administered 2016-03-11: 10:00:00

## 2016-03-11 MED ORDER — ACETAMINOPHEN 160 MG/5ML PO SOLN: 325.0000 mg | ORAL | Status: DC | PRN

## 2016-03-11 MED ORDER — LIDOCAINE HCL (CARDIAC) 20 MG/ML IV SOLN
INTRAVENOUS | Status: DC | PRN
  Administered 2016-03-11: 50 mg via INTRAVENOUS

## 2016-03-11 MED ORDER — LACTATED RINGERS IV SOLN
INTRAVENOUS | Status: DC
  Administered 2016-03-11: 09:00:00 via INTRAVENOUS

## 2016-03-11 MED ORDER — ACETAMINOPHEN 325 MG PO TABS: 325.0000 mg | ORAL_TABLET | ORAL | Status: DC | PRN

## 2016-03-11 MED ORDER — PROPOFOL 10 MG/ML IV BOLUS
INTRAVENOUS | Status: DC | PRN
  Administered 2016-03-11: 20 mg via INTRAVENOUS
  Administered 2016-03-11: 100 mg via INTRAVENOUS
  Administered 2016-03-11 (×2): 20 mg via INTRAVENOUS
  Administered 2016-03-11: 50 mg via INTRAVENOUS
  Administered 2016-03-11: 20 mg via INTRAVENOUS
  Administered 2016-03-11: 10 mg via INTRAVENOUS

## 2016-03-11 SURGICAL SUPPLY — 23 items

## 2016-03-11 NOTE — Anesthesia Procedure Notes (Signed)
Procedure Name: MAC Date/Time: 03/11/2016 9:53 AM Performed by: Janna Arch Pre-anesthesia Checklist: Patient identified, Emergency Drugs available, Suction available and Patient being monitored Patient Re-evaluated:Patient Re-evaluated prior to inductionOxygen Delivery Method: Nasal cannula

## 2016-03-11 NOTE — Transfer of Care (Signed)
Immediate Anesthesia Transfer of Care Note  Patient: Scott Obrien  Procedure(s) Performed: Procedure(s): COLONOSCOPY WITH PROPOFOL (N/A) POLYPECTOMY  Patient Location: PACU  Anesthesia Type: MAC  Level of Consciousness: awake, alert  and patient cooperative  Airway and Oxygen Therapy: Patient Spontanous Breathing and Patient connected to supplemental oxygen  Post-op Assessment: Post-op Vital signs reviewed, Patient's Cardiovascular Status Stable, Respiratory Function Stable, Patent Airway and No signs of Nausea or vomiting  Post-op Vital Signs: Reviewed and stable  Complications: No apparent anesthesia complications

## 2016-03-11 NOTE — H&P (Signed)
  Lucilla Lame, MD [facility] [address], [place], [place] [address] Phone: [phone] Fax : [phone]  Primary Care Physician:  Lelon Huh, MD Primary Gastroenterologist:  Dr. Allen Norris  Pre-Procedure History & Physical: HPI:  Scott Obrien is a 50 y.o. male is here for a screening colonoscopy.   Past Medical History:  Diagnosis Date  . GERD (gastroesophageal reflux disease)     Past Surgical History:  Procedure Laterality Date  . APPENDECTOMY    . HERNIA REPAIR  2002  . KNEE ARTHROSCOPY Left 01/21/2015   Procedure: ARTHROSCOPY KNEE;  Surgeon: Earnestine Leys, MD;  Location: [facility] ORS;  Service: Orthopedics;  Laterality: Left;  . UPPER GI ENDOSCOPY  08/10/2011   Dr. Dionne Milo, Chronic inactive gastritis. No untestinal metapasia. No H. Pylori. Normal esophagus    Prior to Admission medications   Medication Sig Start Date End Date Taking? Authorizing Provider  ibuprofen (ADVIL,MOTRIN) 200 MG tablet Take 200 mg by mouth every 6 (six) hours as needed.   Yes Historical Provider, MD  ranitidine (ZANTAC) 300 MG tablet Take 1 tablet (300 mg total) by mouth daily. 09/30/15  Yes Birdie Sons, MD  Na Sulfate-K Sulfate-Mg Sulf (SUPREP BOWEL PREP KIT) 17.5-3.13-1.6 GM/180ML SOLN Take 1 kit by mouth as directed. 03/08/16   Lucilla Lame, MD    Allergies as of 02/11/2016  . (No Known Allergies)    Family History  Problem Relation Age of Onset  . Diverticulitis Mother   . Stroke Maternal Grandmother   . Hodgkin's lymphoma Paternal Grandfather   . Heart disease Other     had CABG    Social History   Social History  . Marital status: Married    Spouse name: N/A  . Number of children: 4  . Years of education: N/A   Occupational History  . 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 Smoking status: Former Research scientist (life sciences)  . Smokeless tobacco: Never Used     Comment: Smoked occasionally, but quit at age 16  . Alcohol use No  . Drug use: No  .  Sexual activity: Not on file   Other Topics Concern  . Not on file   Social History Narrative  . No narrative on file    Review of Systems: See HPI, otherwise negative ROS  Physical Exam: BP (!) 160/98   Pulse 61   Temp 97.8 F (36.6 C) (Temporal)   Ht 6' (1.829 m)   Wt 182 lb (82.6 kg)   SpO2 99%   BMI 24.68 kg/m  General:   Alert,  pleasant and cooperative in NAD Head:  Normocephalic and atraumatic. Neck:  Supple; no masses or thyromegaly. Lungs:  Clear throughout to auscultation.    Heart:  Regular rate and rhythm. Abdomen:  Soft, nontender and nondistended. Normal bowel sounds, without guarding, and without rebound.   Neurologic:  Alert and  oriented x4;  grossly normal neurologically.  Impression/Plan: Scott Obrien is now here to undergo a screening colonoscopy.  Risks, benefits, and alternatives regarding colonoscopy have been reviewed with the patient.  Questions have been answered.  All parties agreeable.

## 2016-03-11 NOTE — Anesthesia Postprocedure Evaluation (Signed)
Anesthesia Post Note  Patient: Scott Obrien  Procedure(s) Performed: Procedure(s) (LRB): COLONOSCOPY WITH PROPOFOL (N/A) POLYPECTOMY  Patient location during evaluation: PACU Anesthesia Type: MAC Level of consciousness: awake and alert and oriented Pain management: satisfactory to patient Vital Signs Assessment: post-procedure vital signs reviewed and stable Respiratory status: spontaneous breathing, nonlabored ventilation and respiratory function stable Cardiovascular status: blood pressure returned to baseline and stable Postop Assessment: Adequate PO intake and No signs of nausea or vomiting Anesthetic complications: no    Raliegh Ip

## 2016-03-11 NOTE — Op Note (Signed)
[facility] Gastroenterology Patient Name: Scott Obrien Procedure Date: 03/11/2016 9:53 AM MRN: [id number] Account #: [id number] Date of Birth: February 02, 1966 Admit Type: Outpatient Age: 50 Room: [facility] OR ROOM 01 Gender: Male Note Status: Finalized Procedure:            Colonoscopy Indications:          Screening for colorectal malignant neoplasm Providers:            Lucilla Lame MD, MD Referring MD:         Kirstie Peri. Caryn Section, MD (Referring MD) Medicines:            Propofol per Anesthesia Complications:        No immediate complications. Procedure:            Pre-Anesthesia Assessment:                       - Prior to the procedure, a History and Physical was                        performed, and patient medications and allergies were                        reviewed. The patient's tolerance of previous                        anesthesia was also reviewed. The risks and benefits of                        the procedure and the sedation options and risks were                        discussed with the patient. All questions were                        answered, and informed consent was obtained. Prior                        Anticoagulants: The patient has taken no previous                        anticoagulant or antiplatelet agents. ASA Grade                        Assessment: II - A patient with mild systemic disease.                        After reviewing the risks and benefits, the patient was                        deemed in satisfactory condition to undergo the                        procedure.                       After obtaining informed consent, the colonoscope was                        passed under direct vision. Throughout the procedure,  the patient's blood pressure, pulse, and oxygen                        saturations were monitored continuously. The Olympus CF                        H180AL colonoscope (S#: [id number]) was introduced  through                        the anus and advanced to the the cecum, identified by                        appendiceal orifice and ileocecal valve. The                        colonoscopy was performed without difficulty. The                        patient tolerated the procedure well. The quality of                        the bowel preparation was excellent. Findings:      The perianal and digital rectal examinations were normal.      A 4 mm polyp was found in the transverse colon. The polyp was sessile.       The polyp was removed with a cold biopsy forceps. Resection and       retrieval were complete.      Multiple small-mouthed diverticula were found in the sigmoid colon and       descending colon.      Non-bleeding internal hemorrhoids were found during retroflexion. The       hemorrhoids were Grade I (internal hemorrhoids that do not prolapse). Impression:           - One 4 mm polyp in the transverse colon, removed with                        a cold biopsy forceps. Resected and retrieved.                       - Diverticulosis in the sigmoid colon and in the                        descending colon.                       - Non-bleeding internal hemorrhoids. Recommendation:       - Discharge patient to home.                       - Resume previous diet.                       - Continue present medications.                       - Repeat colonoscopy in 5 years if polyp adenoma and 10                        years if hyperplastic Procedure Code(s):    --- Professional ---  45380, Colonoscopy, flexible; with biopsy, single or                        multiple Diagnosis Code(s):    --- Professional ---                       Z12.11, Encounter for screening for malignant neoplasm                        of colon                       D12.3, Benign neoplasm of transverse colon (hepatic                        flexure or splenic flexure) CPT copyright 2016 American  Medical Association. All rights reserved. The codes documented in this report are preliminary and upon coder review may  be revised to meet current compliance requirements. Lucilla Lame MD, MD 03/11/2016 10:10:50 AM This report has been signed electronically. Number of Addenda: 0 Note Initiated On: 03/11/2016 9:53 AM Scope Withdrawal Time: 0 hours 6 minutes 32 seconds  Total Procedure Duration: 0 hours 8 minutes 57 seconds       [facility]

## 2016-03-11 NOTE — Anesthesia Preprocedure Evaluation (Signed)
Anesthesia Evaluation  Patient identified by MRN, date of birth, ID band Patient awake    Reviewed: Allergy & Precautions, H&P , NPO status , Patient's Chart, lab work & pertinent test results  Airway Mallampati: III  TM Distance: >3 FB Neck ROM: full    Dental no notable dental hx.    Pulmonary former smoker,    Pulmonary exam normal        Cardiovascular Normal cardiovascular exam     Neuro/Psych    GI/Hepatic GERD  ,  Endo/Other    Renal/GU      Musculoskeletal   Abdominal   Peds  Hematology   Anesthesia Other Findings   Reproductive/Obstetrics                             Anesthesia Physical Anesthesia Plan  ASA: II  Anesthesia Plan: MAC   Post-op Pain Management:    Induction:   Airway Management Planned:   Additional Equipment:   Intra-op Plan:   Post-operative Plan:   Informed Consent: I have reviewed the patients History and Physical, chart, labs and discussed the procedure including the risks, benefits and alternatives for the proposed anesthesia with the patient or authorized representative who has indicated his/her understanding and acceptance.     Plan Discussed with:   Anesthesia Plan Comments:         Anesthesia Quick Evaluation

## 2016-03-14 ENCOUNTER — Encounter: Payer: Self-pay | Admitting: Gastroenterology

## 2016-03-15 ENCOUNTER — Encounter: Payer: Self-pay | Admitting: Gastroenterology

## 2016-03-16 ENCOUNTER — Encounter: Payer: Self-pay | Admitting: Gastroenterology

## 2016-04-06 ENCOUNTER — Encounter: Payer: Self-pay | Admitting: Family Medicine

## 2016-04-06 ENCOUNTER — Ambulatory Visit (INDEPENDENT_AMBULATORY_CARE_PROVIDER_SITE_OTHER): Payer: BLUE CROSS/BLUE SHIELD | Admitting: Family Medicine

## 2016-04-06 VITALS — BP 132/98 | HR 83 | Temp 97.7°F | Resp 16 | Wt 196.0 lb

## 2016-04-06 DIAGNOSIS — I10 Essential (primary) hypertension: Secondary | ICD-10-CM

## 2016-04-06 MED ORDER — AMLODIPINE BESYLATE 5 MG PO TABS: 5.0000 mg | ORAL_TABLET | Freq: Every day | ORAL | 2 refills | Status: DC

## 2016-04-06 NOTE — Progress Notes (Signed)
       Patient: Scott Obrien Male    DOB: 22-Oct-1965   50 y.o.   MRN: [id number] Visit Date: 04/06/2016  Today's Provider: Lelon Huh, MD   Chief Complaint  Patient presents with  . Blood Pressure Check   Subjective:    HPI  Elevated blood pressure:  BP Readings from Last 3 Encounters:  03/11/16 (!) 142/99  01/08/16 140/84  09/30/15 (!) 140/100    He was last seen for elevated blood pressure 6 months ago.  BP at that visit was 140/100. Management since that visit includes counseling patient to work on diet and exercise to get blood pressure down. He reports fair compliance with treatment. He is not having side effects.  He is exercising. He is adherent to low salt diet.   Outside blood pressures are running as high as 165/118 . He is experiencing none.  Patient denies chest pain, chest pressure/discomfort, claudication, dyspnea, exertional chest pressure/discomfort, fatigue, irregular heart beat, lower extremity edema, near-syncope, orthopnea, palpitations, paroxysmal nocturnal dyspnea, syncope and tachypnea.   Cardiovascular risk factors include male gender.  Use of agents associated with hypertension: none.     Weight trend: increasing steadily Wt Readings from Last 3 Encounters:  03/11/16 182 lb (82.6 kg)  01/08/16 188 lb (85.3 kg)  09/30/15 195 lb (88.5 kg)    Current diet: in general, an "unhealthy" diet  ------------------------------------------------------------------------     No Known Allergies   Current Outpatient Prescriptions:  .  ibuprofen (ADVIL,MOTRIN) 200 MG tablet, Take 200 mg by mouth every 6 (six) hours as needed., Disp: , Rfl:  .  ranitidine (ZANTAC) 300 MG tablet, Take 1 tablet (300 mg total) by mouth daily., Disp: 30 tablet, Rfl: 12  Review of Systems  Constitutional: Negative for appetite change, chills and fever.  Respiratory: Negative for chest tightness, shortness of breath and wheezing.   Cardiovascular: Negative for  chest pain and palpitations.  Gastrointestinal: Negative for abdominal pain, nausea and vomiting.  Neurological: Positive for headaches.    Social History  Substance Use Topics  . Smoking status: Former Research scientist (life sciences)  . Smokeless tobacco: Never Used     Comment: Smoked occasionally, but quit at age 36  . Alcohol use No   Objective:   BP (!) 142/100 (BP Location: Right Arm, Patient Position: Sitting, Cuff Size: Large)   Pulse 83   Temp 97.7 F (36.5 C) (Oral)   Resp 16   Wt 196 lb (88.9 kg)   SpO2 97% Comment: room air  BMI 26.58 kg/m   Physical Exam   General Appearance:    Alert, cooperative, no distress  Eyes:    PERRL, conjunctiva/corneas clear, EOM's intact       Lungs:     Clear to auscultation bilaterally, respirations unlabored  Heart:    Regular rate and rhythm  Neurologic:   Awake, alert, oriented x 3. No apparent focal neurological           defect.           Assessment & Plan:     1. Essential hypertension Counseled on healthy diet and regular exercise. Start amlodipine. Counseled on potential adverse effectx.  - amLODipine (NORVASC) 5 MG tablet; Take 1 tablet (5 mg total) by mouth daily.  Dispense: 30 tablet; Refill: 2  Return in about 2 months (around 06/06/2016).       Lelon Huh, MD  [facility]

## 2016-06-22 ENCOUNTER — Ambulatory Visit: Payer: BLUE CROSS/BLUE SHIELD | Admitting: Family Medicine

## 2016-06-29 ENCOUNTER — Ambulatory Visit: Payer: BLUE CROSS/BLUE SHIELD | Admitting: Family Medicine

## 2016-07-06 ENCOUNTER — Encounter: Payer: Self-pay | Admitting: Family Medicine

## 2016-07-06 ENCOUNTER — Ambulatory Visit (INDEPENDENT_AMBULATORY_CARE_PROVIDER_SITE_OTHER): Payer: BLUE CROSS/BLUE SHIELD | Admitting: Family Medicine

## 2016-07-06 VITALS — BP 132/90 | HR 84 | Temp 97.7°F | Resp 16 | Wt 199.0 lb

## 2016-07-06 DIAGNOSIS — I10 Essential (primary) hypertension: Secondary | ICD-10-CM | POA: Diagnosis not present

## 2016-07-06 MED ORDER — AMLODIPINE BESYLATE 5 MG PO TABS: 5.0000 mg | ORAL_TABLET | Freq: Every day | ORAL | 2 refills | Status: DC

## 2016-07-06 NOTE — Progress Notes (Signed)
Patient: Scott Obrien Male    DOB: [birth year]   51 y.o.   MRN: [id number] Visit Date: 07/06/2016  Today's Provider: Lelon Huh, MD   Chief Complaint  Patient presents with  . Hypertension    follow up   Subjective:    HPI  Hypertension, follow-up:  BP Readings from Last 3 Encounters:  04/06/16 (!) 132/98  03/11/16 (!) 142/99  01/08/16 140/84    He was last seen for hypertension 3 months ago.  BP at that visit was 132/98. Management since that visit includes adding Amlodipine. He reports good compliance with treatment. He is not having side effects.  He is exercising. He is not adherent to low salt diet.   Outside blood pressures are not being checked. He is experiencing none.  Patient denies chest pain, chest pressure/discomfort, claudication, dyspnea, exertional chest pressure/discomfort, fatigue, irregular heart beat, lower extremity edema, near-syncope, orthopnea, palpitations, paroxysmal nocturnal dyspnea, syncope and tachypnea.   Cardiovascular risk factors include hypertension and male gender.  Use of agents associated with hypertension: NSAIDS.     Weight trend: increasing steadily Wt Readings from Last 3 Encounters:  04/06/16 196 lb (88.9 kg)  03/11/16 182 lb (82.6 kg)  01/08/16 188 lb (85.3 kg)    Current diet: well balanced  ------------------------------------------------------------------------     No Known Allergies   Current Outpatient Prescriptions:  .  amLODipine (NORVASC) 5 MG tablet, Take 1 tablet (5 mg total) by mouth daily., Disp: 30 tablet, Rfl: 2 .  ibuprofen (ADVIL,MOTRIN) 200 MG tablet, Take 200 mg by mouth every 6 (six) hours as needed., Disp: , Rfl:  .  ranitidine (ZANTAC) 300 MG tablet, Take 1 tablet (300 mg total) by mouth daily., Disp: 30 tablet, Rfl: 12  Review of Systems  Constitutional: Negative for appetite change, chills and fever.  Respiratory: Negative for chest tightness, shortness of breath and wheezing.    Cardiovascular: Negative for chest pain and palpitations.  Gastrointestinal: Negative for abdominal pain, nausea and vomiting.    Social History  Substance Use Topics  . Smoking status: Former Research scientist (life sciences)  . Smokeless tobacco: Never Used     Comment: Smoked occasionally, but quit at age 37  . Alcohol use No   Objective:     Vitals:   07/06/16 1629 07/06/16 1632 07/06/16 1641  BP: (!) 138/100 (!) 140/98 132/90  Pulse: 84    Resp: 16    Temp: 97.7 F (36.5 C)    TempSrc: Oral    SpO2: 95%    Weight: 199 lb (90.3 kg)      Physical Exam   General Appearance:    Alert, cooperative, no distress  Eyes:    PERRL, conjunctiva/corneas clear, EOM's intact       Lungs:     Clear to auscultation bilaterally, respirations unlabored  Heart:    Regular rate and rhythm  Neurologic:   Awake, alert, oriented x 3. No apparent focal neurological           defect.           Assessment & Plan:     1. Essential hypertension Improved, although BP still mildly elevated. He has been more sedentary the last few months due to URIs and stomach viruses. He is going to work on exercising more and losing weight. Continue current medications.  Follow up and CPE in 6 months.  - amLODipine (NORVASC) 5 MG tablet; Take 1 tablet (5 mg total) by mouth daily.  Dispense:  90 tablet; Refill: 2       Lelon Huh, MD  [facility]

## 2016-10-14 ENCOUNTER — Other Ambulatory Visit: Payer: Self-pay | Admitting: Family Medicine

## 2017-01-03 ENCOUNTER — Encounter: Payer: Self-pay | Admitting: Family Medicine

## 2017-01-03 ENCOUNTER — Ambulatory Visit (INDEPENDENT_AMBULATORY_CARE_PROVIDER_SITE_OTHER): Payer: BLUE CROSS/BLUE SHIELD | Admitting: Family Medicine

## 2017-01-03 VITALS — BP 150/100 | HR 60 | Temp 97.5°F | Resp 16 | Wt 194.0 lb

## 2017-01-03 DIAGNOSIS — I10 Essential (primary) hypertension: Secondary | ICD-10-CM

## 2017-01-03 DIAGNOSIS — Z125 Encounter for screening for malignant neoplasm of prostate: Secondary | ICD-10-CM | POA: Diagnosis not present

## 2017-01-03 NOTE — Progress Notes (Signed)
Patient: Scott Obrien Male    DOB: 01-18-1966   51 y.o.   MRN: [id number] Visit Date: 01/03/2017  Today's Provider: Lelon Huh, MD   Chief Complaint  Patient presents with  . Hypertension    follow up   Subjective:    HPI  Hypertension, follow-up:  BP Readings from Last 3 Encounters:  07/06/16 132/90  04/06/16 (!) 132/98  03/11/16 (!) 142/99    He was last seen for hypertension 6 months ago.  BP at that visit was 138/100. Management since that visit includes counseling patient to work on exercise and weight loss. He reports good compliance with treatment. He is not having side effects.  He is exercising. He is adherent to low salt diet.   Outside blood pressures are averaging 140/90. He is experiencing none.  Patient denies chest pain, chest pressure/discomfort, claudication, dyspnea, exertional chest pressure/discomfort, fatigue, irregular heart beat, lower extremity edema, near-syncope, orthopnea, palpitations, paroxysmal nocturnal dyspnea, syncope and tachypnea.   Cardiovascular risk factors include hypertension and male gender.  Use of agents associated with hypertension: none.     Weight trend: decreasing steadily Wt Readings from Last 3 Encounters:  07/06/16 199 lb (90.3 kg)  04/06/16 196 lb (88.9 kg)  03/11/16 182 lb (82.6 kg)    Current diet: in general, a "healthy" diet    ------------------------------------------------------------------------  He also states he has trouble not lasting long enough with intercourse. he states he has no trouble attaining or maintaining erections, and no trouble with libido. States this is not much or a change, but has been more concerned with it lately.     No Known Allergies   Current Outpatient Prescriptions:  .  amLODipine (NORVASC) 5 MG tablet, Take 1 tablet (5 mg total) by mouth daily., Disp: 90 tablet, Rfl: 2 .  ibuprofen (ADVIL,MOTRIN) 200 MG tablet, Take 200 mg by mouth every 6 (six) hours as  needed., Disp: , Rfl:  .  ranitidine (ZANTAC) 300 MG tablet, TAKE 1 TABLET BY MOUTH DAILY, Disp: 30 tablet, Rfl: 5  Review of Systems  Constitutional: Negative for appetite change, chills and fever.  Respiratory: Negative for chest tightness, shortness of breath and wheezing.   Cardiovascular: Negative for chest pain and palpitations.  Gastrointestinal: Negative for abdominal pain, nausea and vomiting.    Social History  Substance Use Topics  . Smoking status: Former Research scientist (life sciences)  . Smokeless tobacco: Never Used     Comment: Smoked occasionally, but quit at age 24  . Alcohol use No   Objective:   BP (!) 150/100 (BP Location: Right Arm, Cuff Size: Large)   Pulse 60   Temp (!) 97.5 F (36.4 C) (Oral)   Resp 16   Wt 194 lb (88 kg)   SpO2 95% Comment: room air  BMI 26.31 kg/m  Vitals:   01/03/17 0808 01/03/17 0810  BP: (!) 140/98 (!) 150/100  Pulse: 60   Resp: 16   Temp: (!) 97.5 F (36.4 C)   TempSrc: Oral   SpO2: 95%   Weight: 194 lb (88 kg)      Physical Exam  . General Appearance:    Alert, cooperative, no distress  Eyes:    PERRL, conjunctiva/corneas clear, EOM's intact       Lungs:     Clear to auscultation bilaterally, respirations unlabored  Heart:    Regular rate and rhythm  Neurologic:   Awake, alert, oriented x 3. No apparent focal neurological  defect.           Assessment & Plan:     1. Essential hypertension BP not quite to goal, consider increasing amlodipine after reviewing labs.  - Comprehensive metabolic panel - Lipid panel  2. Prostate cancer screening  - PSA  Discussed SSRI use to help delay climax with intercourse. He is going to look into it and will call back if he decides to try it.       Lelon Huh, MD  [facility]

## 2017-01-04 ENCOUNTER — Telehealth: Payer: Self-pay

## 2017-01-04 DIAGNOSIS — I10 Essential (primary) hypertension: Secondary | ICD-10-CM

## 2017-01-04 LAB — COMPREHENSIVE METABOLIC PANEL
ALT: 20 IU/L (ref 0–44)
AST: 21 IU/L (ref 0–40)
Albumin/Globulin Ratio: 2 (ref 1.2–2.2)
Albumin: 4.5 g/dL (ref 3.5–5.5)
Alkaline Phosphatase: 92 IU/L (ref 39–117)
BILIRUBIN TOTAL: 0.6 mg/dL (ref 0.0–1.2)
BUN/Creatinine Ratio: 16 (ref 9–20)
BUN: 21 mg/dL (ref 6–24)
CO2: 20 mmol/L (ref 20–29)
Calcium: 9.2 mg/dL (ref 8.7–10.2)
Chloride: 106 mmol/L (ref 96–106)
Creatinine, Ser: 1.3 mg/dL — ABNORMAL HIGH (ref 0.76–1.27)
GFR calc Af Amer: 73 mL/min/{1.73_m2} (ref >59)
GFR calc non Af Amer: 63 mL/min/{1.73_m2} (ref >59)
GLUCOSE: 90 mg/dL (ref 65–99)
Globulin, Total: 2.3 g/dL (ref 1.5–4.5)
Potassium: 4.4 mmol/L (ref 3.5–5.2)
Sodium: 141 mmol/L (ref 134–144)
TOTAL PROTEIN: 6.8 g/dL (ref 6.0–8.5)

## 2017-01-04 LAB — LIPID PANEL
Chol/HDL Ratio: 4.9 ratio (ref 0.0–5.0)
Cholesterol, Total: 171 mg/dL (ref 100–199)
HDL: 35 mg/dL — AB (ref >39)
LDL Calculated: 109 mg/dL — ABNORMAL HIGH (ref 0–99)
TRIGLYCERIDES: 135 mg/dL (ref 0–149)
VLDL CHOLESTEROL CAL: 27 mg/dL (ref 5–40)

## 2017-01-04 LAB — PSA: PROSTATE SPECIFIC AG, SERUM: 0.3 ng/mL (ref 0.0–4.0)

## 2017-01-04 MED ORDER — AMLODIPINE BESYLATE 10 MG PO TABS: 10.0000 mg | ORAL_TABLET | Freq: Every day | ORAL | 1 refills | Status: DC

## 2017-01-04 NOTE — Telephone Encounter (Signed)
-----   Message from Birdie Sons, MD sent at 01/04/2017  7:52 AM EDT ----- Blood sugar, kidney functions, PSA  electrolytes and cholesterol are all normal. Need to increase amlodipine to 10mg  daily, #90, rf x 1. If he has a full bottle of 5mg  tablets he can take 2 a day until gone. Follow up for BP check in 3 months.

## 2017-01-04 NOTE — Telephone Encounter (Signed)
Patient advised and verbally voiced understanding. New prescription sent into the pharmacy. Follow up appointment scheduled for 04/06/2017 at 8:15am.

## 2017-04-06 ENCOUNTER — Ambulatory Visit: Payer: Self-pay | Admitting: Family Medicine

## 2017-04-20 ENCOUNTER — Other Ambulatory Visit: Payer: Self-pay | Admitting: Family Medicine

## 2017-04-21 ENCOUNTER — Ambulatory Visit: Payer: Self-pay | Admitting: Family Medicine

## 2017-05-05 ENCOUNTER — Encounter: Payer: Self-pay | Admitting: Family Medicine

## 2017-05-05 ENCOUNTER — Ambulatory Visit: Payer: BLUE CROSS/BLUE SHIELD | Admitting: Family Medicine

## 2017-05-05 VITALS — BP 138/82 | HR 68 | Temp 97.9°F | Resp 16 | Ht 72.0 in | Wt 196.0 lb

## 2017-05-05 DIAGNOSIS — I10 Essential (primary) hypertension: Secondary | ICD-10-CM

## 2017-05-05 DIAGNOSIS — N529 Male erectile dysfunction, unspecified: Secondary | ICD-10-CM

## 2017-05-05 MED ORDER — SILDENAFIL CITRATE 50 MG PO TABS: 50.0000 mg | ORAL_TABLET | Freq: Every day | ORAL | 3 refills | Status: DC | PRN

## 2017-05-05 NOTE — Progress Notes (Signed)
Patient: Scott Obrien Male    DOB: [birth year]   51 y.o.   MRN: [id number] Visit Date: 05/05/2017  Today's Provider: Lelon Huh, MD   Chief Complaint  Patient presents with  . Hypertension   Subjective:    HPI  Hypertension, follow-up:  BP Readings from Last 3 Encounters:  05/05/17 138/82  01/03/17 (!) 150/100  07/06/16 132/90    He was last seen for hypertension 4 months ago.  BP at that visit was 150/100. Management since that visit includes increasing amlodipine to 10mg  daily. He reports good compliance with treatment. He is not having side effects.  He is not exercising. He is adherent to low salt diet.   Outside blood pressures are checked occasionally. He is experiencing none.  Patient denies exertional chest pressure/discomfort, lower extremity edema and palpitations.     Weight trend: stable Wt Readings from Last 3 Encounters:  05/05/17 196 lb (88.9 kg)  01/03/17 194 lb (88 kg)  07/06/16 199 lb (90.3 kg)    Current diet: well balanced   He also reports difficulty maintaining adequate erections for intercourse. States this has been a problems of the last year, No aggravating factors. No change in libido. No chest pain or dyspnea. Has not tried any treatments in past. Is interested in trying ED medications.      No Known Allergies   Current Outpatient Medications:  .  amLODipine (NORVASC) 10 MG tablet, Take 1 tablet (10 mg total) by mouth daily., Disp: 90 tablet, Rfl: 1 .  ibuprofen (ADVIL,MOTRIN) 200 MG tablet, Take 200 mg by mouth every 6 (six) hours as needed., Disp: , Rfl:  .  ranitidine (ZANTAC) 300 MG tablet, TAKE 1 TABLET BY MOUTH DAILY, Disp: 30 tablet, Rfl: 2  Review of Systems  Constitutional: Negative for activity change, appetite change, chills, diaphoresis, fatigue, fever and unexpected weight change.  Respiratory: Negative for apnea and chest tightness.   Cardiovascular: Negative for chest pain, palpitations and leg  swelling.  Neurological: Negative for dizziness, weakness, light-headedness and headaches.    Social History   Tobacco Use  . Smoking status: Former Research scientist (life sciences)  . Smokeless tobacco: Never Used  . Tobacco comment: Smoked occasionally, but quit at age 31  Substance Use Topics  . Alcohol use: No    Alcohol/week: 0.0 oz   Objective:   BP 138/82 (BP Location: Left Arm, Patient Position: Sitting, Cuff Size: Normal)   Pulse 68   Temp 97.9 F (36.6 C)   Resp 16   Ht 6' (1.829 m)   Wt 196 lb (88.9 kg)   BMI 26.58 kg/m  Vitals:   05/05/17 0817  BP: 138/82  Pulse: 68  Resp: 16  Temp: 97.9 F (36.6 C)  Weight: 196 lb (88.9 kg)  Height: 6' (1.829 m)     Physical Exam   General Appearance:    Alert, cooperative, no distress  Eyes:    PERRL, conjunctiva/corneas clear, EOM's intact       Lungs:     Clear to auscultation bilaterally, respirations unlabored  Heart:    Regular rate and rhythm  Neurologic:   Awake, alert, oriented x 3. No apparent focal neurological           defect.            Assessment & Plan:     1. Essential hypertension Much better with increased dose of amlodipine. Continue current medications.    2. Erectile dysfunction, unspecified erectile dysfunction  type Counseled on use and potential adverse effects of sildenafil. No other signs of endocrine disorder, and recent PSA was normal.  - sildenafil (VIAGRA) 50 MG tablet; Take 1 tablet (50 mg total) by mouth daily as needed for erectile dysfunction.  Dispense: 8 tablet; Refill: 3  Recommended flu vaccine which he refused.       Lelon Huh, MD  [facility]

## 2017-06-01 ENCOUNTER — Telehealth: Payer: Self-pay | Admitting: Family Medicine

## 2017-06-01 DIAGNOSIS — I10 Essential (primary) hypertension: Secondary | ICD-10-CM

## 2017-06-01 MED ORDER — AMLODIPINE BESYLATE 10 MG PO TABS: 10.0000 mg | ORAL_TABLET | Freq: Every day | ORAL | 4 refills | Status: DC

## 2017-06-01 NOTE — Telephone Encounter (Signed)
Patient called and stated that he is taking amLODipine (NORVASC) 10 MG tablet wanted to know what to do because it was on the recall list  [place]   CB# [phone]

## 2017-08-31 ENCOUNTER — Other Ambulatory Visit: Payer: Self-pay | Admitting: Family Medicine

## 2018-01-02 NOTE — Progress Notes (Deleted)
Patient: Scott Obrien, Male    DOB: 1966/04/24, 52 y.o.   MRN: [id number] Visit Date: 01/02/2018  Today's Provider: Lelon Huh, MD   No chief complaint on file.  Subjective:    Annual physical exam Scott Obrien is a 52 y.o. male who presents today for health maintenance and complete physical. He feels {DESC; WELL/FAIRLY WELL/POORLY:18703}. He reports exercising ***. He reports he is sleeping {DESC; WELL/FAIRLY WELL/POORLY:18703}.  -----------------------------------------------------------------   Review of Systems  Social History      He  reports that he has quit smoking. He has never used smokeless tobacco. He reports that he does not drink alcohol or use drugs.       Social History   Socioeconomic History  . Marital status: Married    Spouse name: Not on file  . Number of children: 4  . Years of education: Not on file  . Highest education level: Not on file  Occupational History  . 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 Financial resource strain: Not on file  . Food insecurity:    Worry: Not on file    Inability: Not on file  . Transportation needs:    Medical: Not on file    Non-medical: Not on file  Tobacco Use  . Smoking status: Former Research scientist (life sciences)  . Smokeless tobacco: Never Used  . Tobacco comment: Smoked occasionally, but quit at age 71  Substance and Sexual Activity  . Alcohol use: No    Alcohol/week: 0.0 standard drinks  . Drug use: No  . Sexual activity: Not on file  Lifestyle  . Physical activity:    Days per week: Not on file    Minutes per session: Not on file  . Stress: Not on file  Relationships  . Social connections:    Talks on phone: Not on file    Gets together: Not on file    Attends religious service: Not on file    Active member of club or organization: Not on file    Attends meetings of clubs or organizations: Not on file    Relationship status: Not on file  Other Topics  Concern  . Not on file  Social History Narrative  . Not on file    Past Medical History:  Diagnosis Date  . GERD (gastroesophageal reflux disease)      Patient Active Problem List   Diagnosis Date Noted  . Special screening for malignant neoplasms, colon   . Benign neoplasm of transverse colon   . History of frequent ear infections in childhood 01/15/2016  . Essential hypertension 01/15/2016  . Influenza with respiratory manifestation 01/15/2016  . Skin tag 01/08/2016  . GERD (gastroesophageal reflux disease) 09/30/2015  . Can't get food down 09/28/2015  . Bleeding from the nose 10/24/2008    Past Surgical History:  Procedure Laterality Date  . APPENDECTOMY    . COLONOSCOPY WITH PROPOFOL N/A 03/11/2016   Procedure: COLONOSCOPY WITH PROPOFOL;  Surgeon: Lucilla Lame, MD;  Location: [place];  Service: Endoscopy;  Laterality: N/A;  . HERNIA REPAIR  2002  . KNEE ARTHROSCOPY Left 01/21/2015   Procedure: ARTHROSCOPY KNEE;  Surgeon: Earnestine Leys, MD;  Location: [facility];  Service: Orthopedics;  Laterality: Left;  . POLYPECTOMY  03/11/2016   Procedure: POLYPECTOMY;  Surgeon: Lucilla Lame, MD;  Location: [place];  Service: Endoscopy;;  . UPPER GI ENDOSCOPY  08/10/2011   Dr. Dionne Milo, Chronic  inactive gastritis. No untestinal metapasia. No H. Pylori. Normal esophagus    Family History        Family Status  Relation Name Status  . Mother  Alive  . Father  Deceased at age 59       Aorta rupture  . Sister  Alive  . MGM  Deceased at age 55  . MGF  Deceased at age 54  . PGM  Deceased at age 49  . PGF  Deceased at age 71  . Other uncles Alive        His family history includes Diverticulitis in his mother; Heart disease in his other; Hodgkin's lymphoma in his paternal grandfather; Stroke in his maternal grandmother.      No Known Allergies   Current Outpatient Medications:  .  amLODipine (NORVASC) 10 MG tablet, Take 1 tablet (10 mg total) by mouth  daily., Disp: 90 tablet, Rfl: 4 .  ibuprofen (ADVIL,MOTRIN) 200 MG tablet, Take 200 mg by mouth every 6 (six) hours as needed., Disp: , Rfl:  .  ranitidine (ZANTAC) 300 MG tablet, TAKE 1 TABLET BY MOUTH DAILY, Disp: 30 tablet, Rfl: 5 .  sildenafil (VIAGRA) 50 MG tablet, Take 1 tablet (50 mg total) by mouth daily as needed for erectile dysfunction., Disp: 8 tablet, Rfl: 3   Patient Care Team: Birdie Sons, MD as PCP - General (Family Medicine)      Objective:   Vitals: There were no vitals taken for this visit.  There were no vitals filed for this visit.   Physical Exam   Depression Screen PHQ 2/9 Scores 05/05/2017 01/08/2016  PHQ - 2 Score 0 0  PHQ- 9 Score - 2      Assessment & Plan:     Routine Health Maintenance and Physical Exam  Exercise Activities and Dietary recommendations Goals   None     Immunization History  Administered Date(s) Administered  . Tdap 02/21/2007    Health Maintenance  Topic Date Due  . HIV Screening  10/24/1980  . TETANUS/TDAP  02/20/2017  . INFLUENZA VACCINE  12/21/2017  . COLONOSCOPY  03/11/2021     Discussed health benefits of physical activity, and encouraged him to engage in regular exercise appropriate for his age and condition.    --------------------------------------------------------------------    Lelon Huh, MD  [place] [facility]

## 2018-01-04 ENCOUNTER — Encounter: Payer: Self-pay | Admitting: Family Medicine

## 2018-01-30 ENCOUNTER — Encounter: Payer: BLUE CROSS/BLUE SHIELD | Admitting: Family Medicine

## 2018-03-02 ENCOUNTER — Other Ambulatory Visit: Payer: Self-pay | Admitting: Family Medicine

## 2018-03-28 ENCOUNTER — Ambulatory Visit
Admission: RE | Admit: 2018-03-28 | Discharge: 2018-03-28 | Disposition: A | Discharge status: Home / Self Care | Payer: BLUE CROSS/BLUE SHIELD | Source: Ambulatory Visit | Attending: Family Medicine | Admitting: Family Medicine

## 2018-03-28 ENCOUNTER — Encounter: Payer: Self-pay | Admitting: Family Medicine

## 2018-03-28 ENCOUNTER — Ambulatory Visit: Payer: BLUE CROSS/BLUE SHIELD | Admitting: Family Medicine

## 2018-03-28 VITALS — BP 120/74 | HR 64 | Temp 97.7°F | Resp 16 | Wt 195.8 lb

## 2018-03-28 DIAGNOSIS — S8991XA Unspecified injury of right lower leg, initial encounter: Secondary | ICD-10-CM | POA: Diagnosis present

## 2018-03-28 DIAGNOSIS — W5512XA Struck by horse, initial encounter: Secondary | ICD-10-CM | POA: Insufficient documentation

## 2018-03-28 NOTE — Patient Instructions (Signed)
Continue ibuprofen 400-600 mg 3 x day with food. Cold compresses for 20 minutes several x day and leg elevation for swelling. We will call you with the x-ray report.

## 2018-03-28 NOTE — Progress Notes (Signed)
  Subjective:     Patient ID: Scott Obrien, male   DOB: Jan 01, 1966, 52 y.o.   MRN: [id number] Chief Complaint  Patient presents with  . Leg Pain    Patient comes in office today with concerns of right leg pain after being kicked in his lower leg by his horse on 03/27/18. Paitent reports bruising and swelling below his knee and numbness on the lateral side of his leg, patient states that he has a shoot sharp pain down his entire leg. Patient has been taking Ibuprofen PRN.    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 He has applied ice.  Review of Systems     Objective:   Physical Exam  Constitutional: He appears well-developed and well-nourished. No distress.  Cardiovascular:  Pulses:      Dorsalis pedis pulses are 2+ on the right side.       Posterior tibial pulses are 2+ on the right side.  Musculoskeletal: He exhibits no edema (of right distal lower extremity).  Right KF/KE/DF/PF 5/5.  Skin:  Large contusion to medial right lower leg just below his knee.        Assessment:    1. Injury of right lower extremity, initial encounter - DG Tibia/Fibula Right    Plan:    Schedule ibuprofen; continue cold compresses and elevate. Further f/u pending x-ray report.

## 2018-07-13 ENCOUNTER — Other Ambulatory Visit: Payer: Self-pay | Admitting: Family Medicine

## 2018-07-13 DIAGNOSIS — I10 Essential (primary) hypertension: Secondary | ICD-10-CM

## 2019-01-14 ENCOUNTER — Other Ambulatory Visit: Payer: Self-pay | Admitting: Family Medicine

## 2019-01-14 DIAGNOSIS — I10 Essential (primary) hypertension: Secondary | ICD-10-CM

## 2019-01-21 ENCOUNTER — Telehealth: Payer: Self-pay

## 2019-01-21 ENCOUNTER — Other Ambulatory Visit: Payer: Self-pay

## 2019-01-21 DIAGNOSIS — J069 Acute upper respiratory infection, unspecified: Secondary | ICD-10-CM

## 2019-01-21 DIAGNOSIS — Z20822 Contact with and (suspected) exposure to covid-19: Secondary | ICD-10-CM

## 2019-01-21 DIAGNOSIS — Z20828 Contact with and (suspected) exposure to other viral communicable diseases: Secondary | ICD-10-CM

## 2019-01-21 NOTE — Telephone Encounter (Signed)
Patient called stating that his mother's Covid test came back positive today. He is having some symptoms of sinus drainage, nasal congestion, and fatigue. Patient advised to have test done at [facility] today also to self quarantine until he receives his results.

## 2019-01-23 LAB — NOVEL CORONAVIRUS, NAA: SARS-CoV-2, NAA: NOT DETECTED

## 2019-05-11 ENCOUNTER — Other Ambulatory Visit: Payer: Self-pay | Admitting: Family Medicine

## 2019-05-11 DIAGNOSIS — I10 Essential (primary) hypertension: Secondary | ICD-10-CM

## 2019-05-12 NOTE — Telephone Encounter (Signed)
Requested medication (s) are due for refill today: yes  Requested medication (s) are on the active medication list: yes  Last refill:  01/14/2019  Future visit scheduled: no  Notes to clinic:  LOV-03/28/2018 Overdue for office visit Review for refill   Requested Prescriptions  Pending Prescriptions Disp Refills   amLODipine (NORVASC) 10 MG tablet [Pharmacy Med Name: AMLODIPINE BESYLATE 10 MG TAB] 90 tablet 0    Sig: TAKE 1 TABLET BY MOUTH EVERY DAY      Cardiovascular:  Calcium Channel Blockers Failed - 05/11/2019 10:03 AM      Failed - Valid encounter within last 6 months    Recent Outpatient Visits           1 year ago Injury of right lower extremity, initial encounter   [facility] [place], [place]   2 years ago Essential hypertension   [facility] Birdie Sons, MD   2 years ago Essential hypertension   [facility] Birdie Sons, MD   2 years ago Essential hypertension   [facility] Birdie Sons, MD   3 years ago Essential hypertension   [facility] Birdie Sons, MD              Passed - Last BP in normal range    BP Readings from Last 1 Encounters:  03/28/18 120/74

## 2019-08-06 ENCOUNTER — Other Ambulatory Visit: Payer: Self-pay | Admitting: Family Medicine

## 2019-08-06 DIAGNOSIS — I10 Essential (primary) hypertension: Secondary | ICD-10-CM

## 2019-08-06 NOTE — Telephone Encounter (Signed)
Requested medications are due for refill today?  Yes  Requested medications are on active medication list?  Yes  Last Refill:  05/13/2019  # 90 with no refill.  Courtesy refill with reminder for office visit.    Future visit scheduled?  No  Notes to Clinic:  Patient is past due for office visit.

## 2019-08-29 ENCOUNTER — Other Ambulatory Visit: Payer: Self-pay | Admitting: Family Medicine

## 2019-08-29 DIAGNOSIS — I10 Essential (primary) hypertension: Secondary | ICD-10-CM

## 2019-08-29 NOTE — Telephone Encounter (Signed)
Pt called about refill for  amLODipine (NORVASC) 10 MG tablet Pt advised he needs an appt/ Pt scheduled the next available appt on 5.5.21but will need a refill until his appt date/ please advise

## 2019-09-24 NOTE — Progress Notes (Signed)
Established patient visit   Patient: Scott Obrien   DOB: 12-10-1965   54 y.o. Male  MRN: [id number] Visit Date: 09/25/2019  Today's healthcare provider: Lelon Huh, MD   Chief Complaint  Patient presents with  . Hypertension  . Gastroesophageal Reflux   Subjective    HPI  The patient is a former patient of Mariel Sleet, PA-C.  He was last seen for an acute visit in 2019 but, has not been seen for chronic health since 05/05/2017.  GERD, Follow up: He reports good compliance with treatment. He is not having side effects. Marland Kitchen  He IS experiencing no current symptoms.    Hypertension, follow-up:  BP Readings from Last 3 Encounters:  09/25/19 (!) 142/90  03/28/18 120/74  05/05/17 138/82   Wt Readings from Last 3 Encounters:  09/25/19 202 lb (91.6 kg)  03/28/18 195 lb 12.8 oz (88.8 kg)  05/05/17 196 lb (88.9 kg)     He was last seen for hypertension 3 years ago (seen by Carmon Ginsberg, PA-C).  BP at that visit was 138/82. Management since that visit includes continuing same medication.  He reports good compliance with treatment. He is not having side effects.  He is following a Regular diet. He is exercising. He does not smoke.  Use of agents associated with hypertension: none.   Outside blood pressures are 135-140/ 90. Symptoms: No chest pain No chest pressure No palpitations No dyspnea No orthopnea No paroxysmal nocturnal dyspnea No lower extremity edema No syncope   Pertinent labs: Lab Results  Component Value Date   CHOL 171 01/03/2017   HDL 35 (L) 01/03/2017   LDLCALC 109 (H) 01/03/2017   TRIG 135 01/03/2017   CHOLHDL 4.9 01/03/2017   Lab Results  Component Value Date   NA 141 01/03/2017   K 4.4 01/03/2017   CO2 20 01/03/2017   GLUCOSE 90 01/03/2017   BUN 21 01/03/2017   CREATININE 1.30 (H) 01/03/2017   CALCIUM 9.2 01/03/2017   GFRNONAA 63 01/03/2017   GFRAA 73 01/03/2017     The 10-year ASCVD risk score Mikey Bussing DC Jr., et al.,  2013) is: 7.5%     Medications: Outpatient Medications Prior to Visit  Medication Sig  . amLODipine (NORVASC) 10 MG tablet TAKE 1 TABLET BY MOUTH EVERY DAY  . ibuprofen (ADVIL,MOTRIN) 200 MG tablet Take 200 mg by mouth every 6 (six) hours as needed.  . ranitidine (ZANTAC) 300 MG tablet TAKE 1 TABLET BY MOUTH DAILY  . sildenafil (VIAGRA) 50 MG tablet Take 1 tablet (50 mg total) by mouth daily as needed for erectile dysfunction. (Patient not taking: Reported on 09/25/2019)   No facility-administered medications prior to visit.    Review of Systems  Constitutional: Negative for appetite change, chills and fever.  Respiratory: Negative for chest tightness, shortness of breath and wheezing.   Cardiovascular: Negative for chest pain and palpitations.  Gastrointestinal: Negative for abdominal pain, nausea and vomiting.  Skin: Positive for color change (red spots on feet and lower legs).      Objective    BP (!) 142/90 (BP Location: Left Arm, Cuff Size: Large)   Pulse 73   Temp (!) 97.3 F (36.3 C) (Temporal)   Resp 16   Ht 6' (1.829 m)   Wt 202 lb (91.6 kg)   SpO2 97% Comment: room air  BMI 27.40 kg/m    Physical Exam   General: Appearance:     Overweight male in no acute distress  Eyes:    PERRL, conjunctiva/corneas clear, EOM's intact       Lungs:     Clear to auscultation bilaterally, respirations unlabored  Heart:    Normal heart rate. Normal rhythm. No murmurs, rubs, or gallops.   MS:   All extremities are intact.   Derm:   Mild flaking and dark coloration of lateral legs        Assessment & Plan     1. Essential hypertension Well controlled.  Continue current medications.   - Comprehensive metabolic panel - CBC - Lipid panel  2. Gastroesophageal reflux disease, unspecified whether esophagitis present Well controlled. Continue current medications.    3. Prostate cancer screening  - PSA Total (Reflex To Free) ([facility] only)  4. Eczema, unspecified type Can  use OTC steroid cream prn.    No follow-ups on file.      The entirety of the information documented in the History of Present Illness, Review of Systems and Physical Exam were personally obtained by me. Portions of this information were initially documented by the CMA and reviewed by me for thoroughness and accuracy.      Lelon Huh, MD  [facility] [phone] (phone) [phone] (fax)  [place]

## 2019-09-25 ENCOUNTER — Encounter: Payer: Self-pay | Admitting: Family Medicine

## 2019-09-25 ENCOUNTER — Other Ambulatory Visit: Payer: Self-pay

## 2019-09-25 ENCOUNTER — Ambulatory Visit (INDEPENDENT_AMBULATORY_CARE_PROVIDER_SITE_OTHER): Payer: 59 | Admitting: Family Medicine

## 2019-09-25 VITALS — BP 142/90 | HR 73 | Temp 97.3°F | Resp 16 | Ht 72.0 in | Wt 202.0 lb

## 2019-09-25 DIAGNOSIS — L309 Dermatitis, unspecified: Secondary | ICD-10-CM | POA: Diagnosis not present

## 2019-09-25 DIAGNOSIS — Z8616 Personal history of COVID-19: Secondary | ICD-10-CM | POA: Insufficient documentation

## 2019-09-25 DIAGNOSIS — Z125 Encounter for screening for malignant neoplasm of prostate: Secondary | ICD-10-CM

## 2019-09-25 DIAGNOSIS — K219 Gastro-esophageal reflux disease without esophagitis: Secondary | ICD-10-CM

## 2019-09-25 DIAGNOSIS — I10 Essential (primary) hypertension: Secondary | ICD-10-CM | POA: Diagnosis not present

## 2019-09-26 LAB — LIPID PANEL
Chol/HDL Ratio: 4.7 ratio (ref 0.0–5.0)
Cholesterol, Total: 160 mg/dL (ref 100–199)
HDL: 34 mg/dL — ABNORMAL LOW (ref >39)
LDL Chol Calc (NIH): 101 mg/dL — ABNORMAL HIGH (ref 0–99)
Triglycerides: 137 mg/dL (ref 0–149)
VLDL Cholesterol Cal: 25 mg/dL (ref 5–40)

## 2019-09-26 LAB — COMPREHENSIVE METABOLIC PANEL
ALT: 30 IU/L (ref 0–44)
AST: 27 IU/L (ref 0–40)
Albumin/Globulin Ratio: 2.2 (ref 1.2–2.2)
Albumin: 4.4 g/dL (ref 3.8–4.9)
Alkaline Phosphatase: 98 IU/L (ref 39–117)
BUN/Creatinine Ratio: 13 (ref 9–20)
BUN: 16 mg/dL (ref 6–24)
Bilirubin Total: 0.7 mg/dL (ref 0.0–1.2)
CO2: 21 mmol/L (ref 20–29)
Calcium: 9.1 mg/dL (ref 8.7–10.2)
Chloride: 104 mmol/L (ref 96–106)
Creatinine, Ser: 1.28 mg/dL — ABNORMAL HIGH (ref 0.76–1.27)
GFR calc Af Amer: 73 mL/min/{1.73_m2} (ref >59)
GFR calc non Af Amer: 63 mL/min/{1.73_m2} (ref >59)
Globulin, Total: 2 g/dL (ref 1.5–4.5)
Glucose: 87 mg/dL (ref 65–99)
Potassium: 4.3 mmol/L (ref 3.5–5.2)
Sodium: 138 mmol/L (ref 134–144)
Total Protein: 6.4 g/dL (ref 6.0–8.5)

## 2019-09-26 LAB — CBC
Hematocrit: 45.4 % (ref 37.5–51.0)
Hemoglobin: 15.3 g/dL (ref 13.0–17.7)
MCH: 29.4 pg (ref 26.6–33.0)
MCHC: 33.7 g/dL (ref 31.5–35.7)
MCV: 87 fL (ref 79–97)
Platelets: 215 10*3/uL (ref 150–450)
RBC: 5.2 x10E6/uL (ref 4.14–5.80)
RDW: 13.7 % (ref 11.6–15.4)
WBC: 4.9 10*3/uL (ref 3.4–10.8)

## 2019-09-26 LAB — PSA TOTAL (REFLEX TO FREE): Prostate Specific Ag, Serum: 0.3 ng/mL (ref 0.0–4.0)

## 2019-11-20 ENCOUNTER — Other Ambulatory Visit: Payer: Self-pay | Admitting: Family Medicine

## 2019-11-20 DIAGNOSIS — I10 Essential (primary) hypertension: Secondary | ICD-10-CM

## 2019-11-20 NOTE — Telephone Encounter (Signed)
Requested Prescriptions  Pending Prescriptions Disp Refills  . amLODipine (NORVASC) 10 MG tablet [Pharmacy Med Name: AMLODIPINE BESYLATE 10 MG TAB] 90 tablet 1    Sig: TAKE 1 TABLET BY MOUTH EVERY DAY     Cardiovascular:  Calcium Channel Blockers Failed - 11/20/2019  1:19 AM      Failed - Last BP in normal range    BP Readings from Last 1 Encounters:  09/25/19 (!) 142/90         Passed - Valid encounter within last 6 months    Recent Outpatient Visits          1 month ago Essential hypertension   [facility] Birdie Sons, MD   1 year ago Injury of right lower extremity, initial encounter   [facility] [place], [place]   2 years ago Essential hypertension   [facility] Birdie Sons, MD   2 years ago Essential hypertension   [facility] Birdie Sons, MD   3 years ago Essential hypertension   [facility] Birdie Sons, MD

## 2020-05-27 ENCOUNTER — Other Ambulatory Visit: Payer: Self-pay | Admitting: Family Medicine

## 2020-05-27 DIAGNOSIS — I10 Essential (primary) hypertension: Secondary | ICD-10-CM

## 2020-06-08 ENCOUNTER — Ambulatory Visit: Payer: Self-pay | Admitting: *Deleted

## 2020-06-08 NOTE — Telephone Encounter (Signed)
He can be seen in office so long as he isn't having any Covid sx.

## 2020-06-08 NOTE — Telephone Encounter (Signed)
Patient is calling to report elevated BP and some dizziness this morning. Patient has possible COVID exposure 1/7 with son- (not tested but exposure and symptoms). Patient has taken home test that is negative today. Patient states slight dizziness is only symptom with elevated BP today. Advised virtual visit and will send note to see if in person visit would be allowed.   Reason for Disposition . [1] Taking BP medications AND [2] feels is having side effects (e.g., impotence, cough, dizzy upon standing)  Answer Assessment - Initial Assessment Questions 1. BLOOD PRESSURE: "What is the blood pressure?" "Did you take at least two measurements 5 minutes apart?"     155/95, 155/95 2. ONSET: "When did you take your blood pressure?"     8:00, 11:30 3. HOW: "How did you obtain the blood pressure?" (e.g., visiting nurse, automatic home BP monitor)     Automatic cuff- arm 4. HISTORY: "Do you have a history of high blood pressure?"     yes 5. MEDICATIONS: "Are you taking any medications for blood pressure?" "Have you missed any doses recently?"     No missed doses, no changes 6. OTHER SYMPTOMS: "Do you have any symptoms?" (e.g., headache, chest pain, blurred vision, difficulty breathing, weakness)     Dizziness- O2sat- 92 this morning, possible COVID exposure- home test negative 7. PREGNANCY: "Is there any chance you are pregnant?" "When was your last menstrual period?"     n/a  Protocols used: BLOOD PRESSURE - HIGH-A-AH

## 2020-06-09 ENCOUNTER — Encounter: Payer: Self-pay | Admitting: Family Medicine

## 2020-06-09 ENCOUNTER — Telehealth (INDEPENDENT_AMBULATORY_CARE_PROVIDER_SITE_OTHER): Payer: Self-pay | Admitting: Family Medicine

## 2020-06-09 DIAGNOSIS — J0110 Acute frontal sinusitis, unspecified: Secondary | ICD-10-CM

## 2020-06-09 MED ORDER — AMOXICILLIN 875 MG PO TABS: 875.0000 mg | ORAL_TABLET | Freq: Two times a day (BID) | ORAL | 0 refills | Status: DC

## 2020-06-09 NOTE — Progress Notes (Signed)
MyChart Video Visit    Virtual Visit via Video Note   This visit type was conducted due to national recommendations for restrictions regarding the COVID-19 Pandemic (e.g. social distancing) in an effort to limit this patient's exposure and mitigate transmission in our community. This patient is at least at moderate risk for complications without adequate follow up. This format is felt to be most appropriate for this patient at this time. Physical exam was limited by quality of the video and audio technology used for the visit.   Patient location: home Provider location: home  I discussed the limitations of evaluation and management by telemedicine and the availability of in person appointments. The patient expressed understanding and agreed to proceed.  Patient: Scott Obrien   DOB: 06/14/1965   55 y.o. Male  MRN: [id number] Visit Date: 06/09/2020  Today's healthcare provider: Vernie Murders, PA-C   No chief complaint on file.  Subjective    HPI  Patient is a 55 year old male who presents via video visit for evaluation of elevated blood pressure.  Patient states that he has a history of hypertension and his pressure is usually a little elevated at 130/90.  Since 06/07/20 he has had readings running from 117/72-155/95.   He states he felt bad on the evening of 06/07/20, described it as being like when he had Covid last year.  He reports his son got sick last week and they assumed it was Covid because his girlfriend was Covid positive.  However, he did not actually get tested.  The patient did test yesterday and it was negative.  He denies any loss of taste of smell.   Past Medical History:  Diagnosis Date  . GERD (gastroesophageal reflux disease)    Past Surgical History:  Procedure Laterality Date  . APPENDECTOMY    . COLONOSCOPY WITH PROPOFOL N/A 03/11/2016   Procedure: COLONOSCOPY WITH PROPOFOL;  Surgeon: Lucilla Lame, MD;  Location: [facility];  Service: Endoscopy;   Laterality: N/A;  . HERNIA REPAIR  2002  . KNEE ARTHROSCOPY Left 01/21/2015   Procedure: ARTHROSCOPY KNEE;  Surgeon: Earnestine Leys, MD;  Location: [facility];  Service: Orthopedics;  Laterality: Left;  . POLYPECTOMY  03/11/2016   Procedure: POLYPECTOMY;  Surgeon: Lucilla Lame, MD;  Location: [facility];  Service: Endoscopy;;  . UPPER GI ENDOSCOPY  08/10/2011   Dr. Dionne Milo, Chronic inactive gastritis. No untestinal metapasia. No H. Pylori. Normal esophagus   Family History  Problem Relation Age of Onset  . Diverticulitis Mother   . Stroke Maternal Grandmother   . Hodgkin's lymphoma Paternal Grandfather   . Heart disease Other        had CABG   No Known Allergies Social History   Tobacco Use  . Smoking status: Former Research scientist (life sciences)  . Smokeless tobacco: Never Used  . Tobacco comment: Smoked occasionally, but quit at age 66  Substance Use Topics  . Alcohol use: No    Alcohol/week: 0.0 standard drinks  . Drug use: No    Medications: Outpatient Medications Prior to Visit  Medication Sig  . amLODipine (NORVASC) 10 MG tablet TAKE 1 TABLET BY MOUTH EVERY DAY  . ibuprofen (ADVIL,MOTRIN) 200 MG tablet Take 200 mg by mouth every 6 (six) hours as needed.  . ranitidine (ZANTAC) 300 MG tablet TAKE 1 TABLET BY MOUTH DAILY  . sildenafil (VIAGRA) 50 MG tablet Take 1 tablet (50 mg total) by mouth daily as needed for erectile dysfunction. (Patient not taking: No sig  reported)   No facility-administered medications prior to visit.    Review of Systems  Constitutional: Positive for fatigue and fever (low grade).  HENT: Positive for congestion (head), ear pain, sinus pressure and sinus pain. Negative for ear discharge, postnasal drip, rhinorrhea, sneezing and sore throat.   Respiratory: Positive for cough (non productive). Negative for shortness of breath and wheezing.   Cardiovascular: Negative for chest pain.  Gastrointestinal: Negative for abdominal pain and diarrhea.  Musculoskeletal:  Positive for myalgias.  Neurological: Positive for light-headedness and headaches. Negative for weakness.       Objective    There were no vitals taken for this visit.   Physical Exam: WDWN male in no apparent distress.  Head: Normocephalic, atraumatic. Neck: Supple, NROM Respiratory: No apparent distress Psych: Normal mood and affect     Assessment & Plan    1. Subacute frontal sinusitis Developed frontal head congestion with PND, stopped up ear sensation and only slight cough occasionally over the past 36-48 hours. He did a COVID test yesterday that was negative. He has had 2 COVID vaccinations (Moderna). No fever, loss of taste or smell. Symptoms are not as bad as when he had COVID infection January 2021.Using Coricidin-HBP, Zinc, Vitamin D and C with slight improvement today. Will treat with antibiotic and continue this OTC treatment. Should recheck COVID test if symptoms worsen or persist over the next 2-3 days. Increase fluid intake and may use Tylenol or Advil prn. - amoxicillin (AMOXIL) 875 MG tablet; Take 1 tablet (875 mg total) by mouth 2 (two) times daily.  Dispense: 20 tablet; Refill: 0   No follow-ups on file.     I discussed the assessment and treatment plan with the patient. The patient was provided an opportunity to ask questions and all were answered. The patient agreed with the plan and demonstrated an understanding of the instructions.   The patient was advised to call back or seek an in-person evaluation if the symptoms worsen or if the condition fails to improve as anticipated.  I provided 20 minutes of non-face-to-face time during this encounter.  I, Ashwini Jago, PA-C, have reviewed all documentation for this visit. The documentation on 06/09/20 for the exam, diagnosis, procedures, and orders are all accurate and complete.   Vernie Murders, PA-C [employer] [phone] (phone) [phone] (fax)  [place]

## 2020-06-09 NOTE — Telephone Encounter (Signed)
Scott Obrien is working remote and all of his appointments are virtual today. No available appointments left today on Dr. Maralyn Sago schedule.

## 2020-06-17 IMAGING — CR DG TIBIA/FIBULA 2V*R*
1 series · 4 of 4 positions shown · non-contrast
Comparison: None.

CLINICAL DATA: Kicked by horse yesterday. Pain and numbness along
the lateral aspect.

EXAM:
RIGHT TIBIA AND FIBULA - 2 VIEW

[Series 1: dg tibia/fibula right · 0.14mm/px · 4 of 4 slices shown]
[im 1/4]
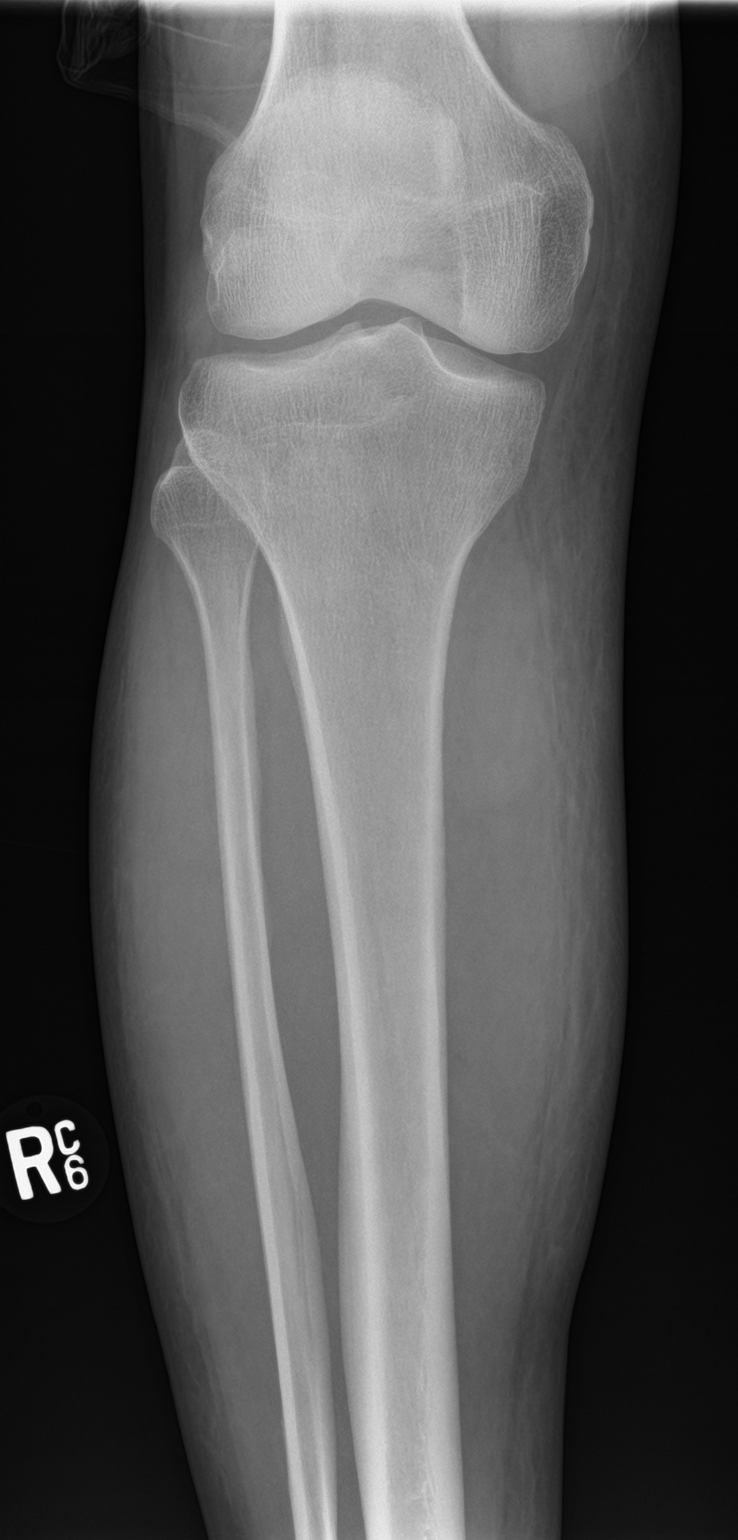
[im 2/4]
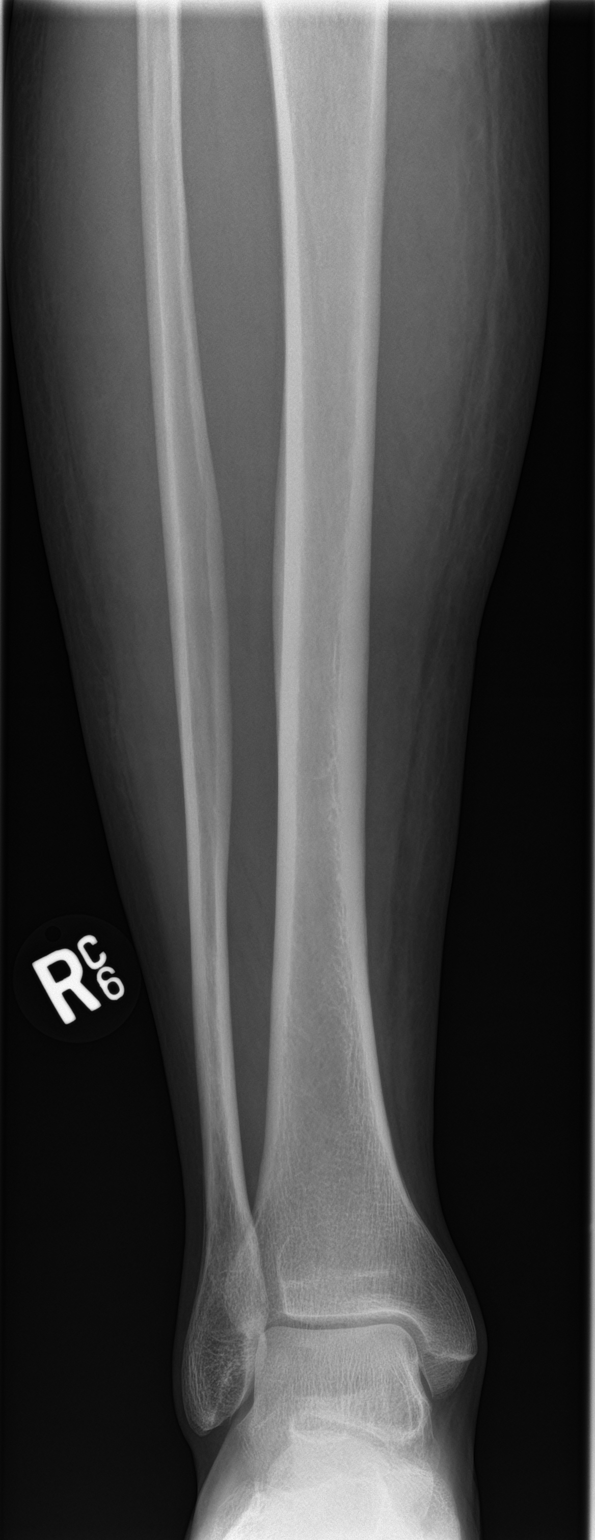
[im 3/4]
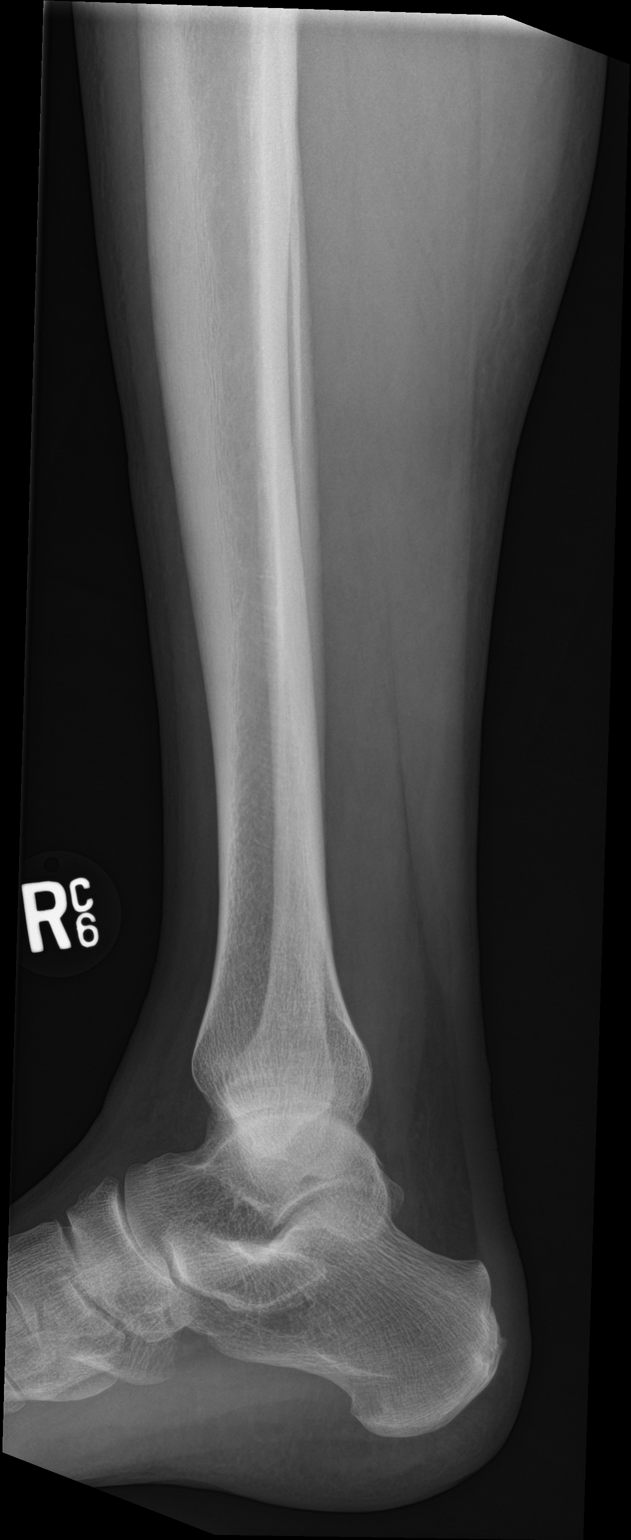
[im 4/4]
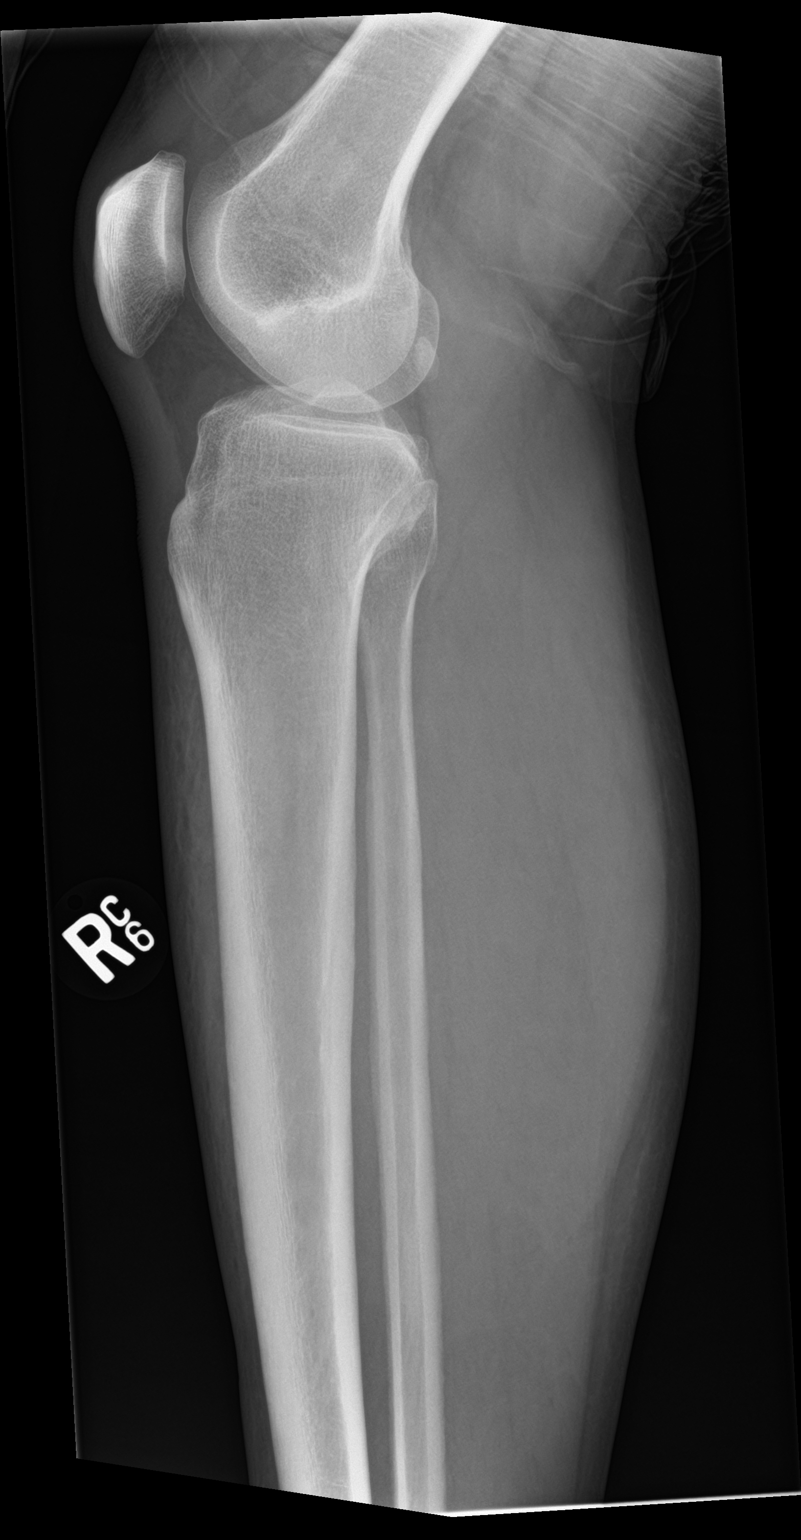

[4 of 4 positions shown; findings below may reference images not displayed]

FINDINGS: There is no evidence of fracture or other focal bone lesions. Soft
tissues are unremarkable.
IMPRESSION: No acute osseous injury of the right tibia and fibula.

## 2020-06-23 ENCOUNTER — Other Ambulatory Visit: Payer: Self-pay | Admitting: Family Medicine

## 2020-06-23 DIAGNOSIS — I10 Essential (primary) hypertension: Secondary | ICD-10-CM

## 2020-09-26 ENCOUNTER — Other Ambulatory Visit: Payer: Self-pay | Admitting: Family Medicine

## 2020-09-26 DIAGNOSIS — I10 Essential (primary) hypertension: Secondary | ICD-10-CM

## 2020-12-08 ENCOUNTER — Telehealth: Payer: Self-pay

## 2020-12-08 NOTE — Telephone Encounter (Signed)
MyChart visit 12/09/2020 at 10am.  Pt advised.   Thanks,   -Mickel Baas

## 2020-12-08 NOTE — Telephone Encounter (Signed)
Copied from [place] [phone]. Topic: General - Other >> Dec 08, 2020 10:39 AM Tessa Lerner A wrote: Reason for CRM: Patient has called to share that they have experienced congestion and cough for roughly three weeks  The patient has tested negative for COVID 19 but shares that their cough has not gotten any better over the past weeks   The patient has declined to schedule an appt at the time of call with agent  The patient would like to be prescribed something to help with the congestion and cough

## 2020-12-09 ENCOUNTER — Telehealth (INDEPENDENT_AMBULATORY_CARE_PROVIDER_SITE_OTHER): Payer: Self-pay | Admitting: Internal Medicine

## 2020-12-09 ENCOUNTER — Encounter: Payer: Self-pay | Admitting: Internal Medicine

## 2020-12-09 DIAGNOSIS — R059 Cough, unspecified: Secondary | ICD-10-CM

## 2020-12-09 MED ORDER — BENZONATATE 100 MG PO CAPS
100.0000 mg | ORAL_CAPSULE | Freq: Two times a day (BID) | ORAL | 0 refills | Status: DC | PRN
Start: 2020-12-09 — End: 2021-03-10

## 2020-12-09 MED ORDER — FEXOFENADINE HCL 180 MG PO TABS: 180.0000 mg | ORAL_TABLET | Freq: Every day | ORAL | 1 refills | Status: AC

## 2020-12-09 MED ORDER — AZITHROMYCIN 250 MG PO TABS
ORAL_TABLET | ORAL | 0 refills | Status: AC
Start: 2020-12-09 — End: 2020-12-14

## 2020-12-09 NOTE — Progress Notes (Addendum)
There were no vitals taken for this visit.   Subjective:    Patient ID: Scott Obrien, male    DOB: [birth year], 55 y.o.   MRN: [id number]  Chief Complaint  Patient presents with   Cough    For past 3 weeks, covid neg yesterday with at home test.   Nasal Congestion    For past 3 weeks,    HPI: Scott Obrien is a 55 y.o. male   This visit was completed via video visit through [place] due to the restrictions of the COVID-19 pandemic. All issues as above were discussed and addressed. Physical exam was done as above through visual confirmation on video through MyChart. If it was felt that the patient should be evaluated in the office, they were directed there. The patient verbally consented to this visit. Location of the patient: home Location of the provider: work Those involved with this call:  Provider: Charlynne Cousins, MD CMA: Frazier Butt, [place] Desk/Registration: Jill Side  Time spent on call: 10 minutes with patient face to face via video conference. More than 50% of this time was spent in counseling and coordination of care. 10 minutes total spent in review of patient's record and preparation of their chart.    Cough This is a recurrent (covid x 1 yr ago, took covid home test was -ve yetserday no fever or chills per pt doesnt feel bad except for congestion drainage and coughin - is coughing up a lot of green phlegm) problem.   Chief Complaint  Patient presents with   Cough    For past 3 weeks, covid neg yesterday with at home test.   Nasal Congestion    For past 3 weeks,    Relevant past medical, surgical, family and social history reviewed and updated as indicated. Interim medical history since our last visit reviewed. Allergies and medications reviewed and updated.  Review of Systems  Respiratory:  Positive for cough.    Per HPI unless specifically indicated above     Objective:    There were no vitals taken for this visit.  Wt Readings from  Last 3 Encounters:  09/25/19 202 lb (91.6 kg)  03/28/18 195 lb 12.8 oz (88.8 kg)  05/05/17 196 lb (88.9 kg)    Physical Exam  Unable to peform sec to virtual visit.       Current Outpatient Medications:    amLODipine (NORVASC) 10 MG tablet, TAKE 1 TABLET BY MOUTH EVERY DAY, Disp: 90 tablet, Rfl: 0   ibuprofen (ADVIL,MOTRIN) 200 MG tablet, Take 200 mg by mouth every 6 (six) hours as needed., Disp: , Rfl:    omeprazole (PRILOSEC) 20 MG capsule, omeprazole 20 mg capsule,delayed release, Disp: , Rfl:    ranitidine (ZANTAC) 300 MG tablet, TAKE 1 TABLET BY MOUTH DAILY, Disp: 90 tablet, Rfl: 0   sildenafil (VIAGRA) 50 MG tablet, Take 1 tablet (50 mg total) by mouth daily as needed for erectile dysfunction. (Patient not taking: No sig reported), Disp: 8 tablet, Rfl: 3    Assessment & Plan:  COVID : negative per home testing todayPer home test this morning , check CXR start zpak Pt to get PCR test [facility] and call with results to quarantine till PCR back  Increase fluid intake. - tyelnol every 4-6 hrs prn and alternate this with ibubrufen 800 mg q 8 hrly. Sinus pressure: use steam inhalation.  OTC -  Allegra / claritin.  Pt verbalized understanding of such, to get to the office  at today and get a curb side test for the above.  Problem List Items Addressed This Visit   None    No orders of the defined types were placed in this encounter.   Meds ordered this encounter  Medications   azithromycin (ZITHROMAX) 250 MG tablet    Sig: Take 2 tablets on day 1, then 1 tablet daily on days 2 through 5    Dispense:  6 tablet    Refill:  0   fexofenadine (ALLEGRA ALLERGY) 180 MG tablet    Sig: Take 1 tablet (180 mg total) by mouth daily.    Dispense:  10 tablet    Refill:  1   benzonatate (TESSALON) 100 MG capsule    Sig: Take 1 capsule (100 mg total) by mouth 2 (two) times daily as needed for cough.    Dispense:  20 capsule    Refill:  0     Follow up plan: No follow-ups on file.

## 2020-12-23 ENCOUNTER — Other Ambulatory Visit: Payer: Self-pay | Admitting: Family Medicine

## 2020-12-23 DIAGNOSIS — I10 Essential (primary) hypertension: Secondary | ICD-10-CM

## 2020-12-23 NOTE — Telephone Encounter (Signed)
Requested medications are due for refill today.  yes  Requested medications are on the active medications list.  yes  Last refill. 09/26/2020  Future visit scheduled.   no  Notes to clinic.  Pt has not been seen for hypertension since 09/25/2019.

## 2021-01-27 ENCOUNTER — Other Ambulatory Visit: Payer: Self-pay | Admitting: Family Medicine

## 2021-01-27 DIAGNOSIS — I10 Essential (primary) hypertension: Secondary | ICD-10-CM

## 2021-01-27 MED ORDER — AMLODIPINE BESYLATE 10 MG PO TABS: 10.0000 mg | ORAL_TABLET | Freq: Every day | ORAL | 1 refills | Status: DC

## 2021-01-27 NOTE — Telephone Encounter (Signed)
PATIENT OUT OF MEDICATION/Medication Refill - Medication: amLODipine (NORVASC) 10 MG tablet   Has the patient contacted their pharmacy? yes (Agent: If no, request that the patient contact the pharmacy for the refill.) (Agent: If yes, when and what did the pharmacy advise?)contact pcp  Preferred Pharmacy (with phone number or street name):  [id number] [place]:  [phone] Fax:  [phone]     Agent: Please be advised that RX refills may take up to 3 business days. We ask that you follow-up with your pharmacy.

## 2021-02-03 ENCOUNTER — Ambulatory Visit: Payer: Self-pay | Admitting: *Deleted

## 2021-02-03 ENCOUNTER — Encounter: Payer: Self-pay | Admitting: Family Medicine

## 2021-02-03 NOTE — Telephone Encounter (Signed)
Per agent: "Patient experiencing congestion, runny nose, throat drainage for 3 days, scheduled patient a office visit for 02/08/2021 seeking clinical advice. "    Pt reports headache, frontal "At sinus areas" onset Saturday.Reports congestion, cough yesterday. Cough productive for clear mucous,mild sore throat "I think from the drainage." Denies fever , no SOB. States "Probably a sinus infection I had before, Dr. B called in some meds for me." Advised would need to be seen; next available per agent 02/08/21. Advised pt to covid test; states has home test and will do so. Assured pt NT would route to practice for PCPs review and final disposition.  CB# [phone] Reason for Disposition  Cough with cold symptoms (e.g., runny nose, postnasal drip, throat clearing)  Answer Assessment - Initial Assessment Questions 1. ONSET: "When did the cough begin?"      Headache Sunday, cough yesterday 2. SEVERITY: "How bad is the cough today?"      Mostly in mornings and nights 3. SPUTUM: "Describe the color of your sputum" (none, dry cough; clear, white, yellow, green)     Clear mucous 4. HEMOPTYSIS: "Are you coughing up any blood?" If so ask: "How much?" (flecks, streaks, tablespoons, etc.)     no 5. DIFFICULTY BREATHING: "Are you having difficulty breathing?" If Yes, ask: "How bad is it?" (e.g., mild, moderate, severe)    - MILD: No SOB at rest, mild SOB with walking, speaks normally in sentences, can lie down, no retractions, pulse < 100.    - MODERATE: SOB at rest, SOB with minimal exertion and prefers to sit, cannot lie down flat, speaks in phrases, mild retractions, audible wheezing, pulse 100-120.    - SEVERE: Very SOB at rest, speaks in single words, struggling to breathe, sitting hunched forward, retractions, pulse > 120      No 6. FEVER: "Do you have a fever?" If Yes, ask: "What is your temperature, how was it measured, and when did it start?"     no 7. CARDIAC HISTORY: "Do you have any history of  heart disease?" (e.g., heart attack, congestive heart failure)      *No Answer* 8. LUNG HISTORY: "Do you have any history of lung disease?"  (e.g., pulmonary embolus, asthma, emphysema)     *No Answer* 9. PE RISK FACTORS: "Do you have a history of blood clots?" (or: recent major surgery, recent prolonged travel, bedridden)     *No Answer* 10. OTHER SYMPTOMS: "Do you have any other symptoms?" (e.g., runny nose, wheezing, chest pain)       Headache in front, sinuses  12. TRAVEL: "Have you traveled out of the country in the last month?" (e.g., travel history, exposures)       *No Answer*  Protocols used: Cough - Acute Productive-A-AH

## 2021-02-08 ENCOUNTER — Ambulatory Visit: Payer: Self-pay | Admitting: Family Medicine

## 2021-03-09 NOTE — Progress Notes (Signed)
Established patient visit   Patient: Scott Obrien   DOB: May 31, 1965   55 y.o. Male  MRN: [id number] Visit Date: 03/10/2021  Today's healthcare provider: Lelon Huh, MD   Chief Complaint  Patient presents with   Hypertension   Gastroesophageal Reflux   Subjective    Gastroesophageal Reflux He complains of coughing. He reports no choking, no heartburn, no hoarse voice, no sore throat or no wheezing. This is a chronic problem. The problem has been better. He has tried a PPI for the symptoms and has no heartburn or dysphagia as long as he takes it every day. He does states he feels like he gets drainage down his throat after eating which makes him cough, but resolves after a while. The treatment provided moderate relief.     Hypertension, follow-up  BP Readings from Last 3 Encounters:  03/10/21 (!) 140/96  09/25/19 (!) 142/90  03/28/18 120/74   Wt Readings from Last 3 Encounters:  03/10/21 201 lb (91.2 kg)  09/25/19 202 lb (91.6 kg)  03/28/18 195 lb 12.8 oz (88.8 kg)     He was last seen for hypertension 1  year  ago.  BP at that visit was 142/90. Management since that visit includes no changes.  He reports excellent compliance with treatment. He is not having side effects.  He is following a Low Sodium diet. He is exercising. He does not smoke.  Use of agents associated with hypertension: none.   Outside blood pressures are normal at home. Symptoms: No chest pain No chest pressure  No palpitations No syncope  No dyspnea No orthopnea  No paroxysmal nocturnal dyspnea No lower extremity edema   Pertinent labs: Lab Results  Component Value Date   CHOL 160 09/25/2019   HDL 34 (L) 09/25/2019   LDLCALC 101 (H) 09/25/2019   TRIG 137 09/25/2019   CHOLHDL 4.7 09/25/2019   Lab Results  Component Value Date   NA 138 09/25/2019   K 4.3 09/25/2019   CREATININE 1.28 (H) 09/25/2019   GFRNONAA 63 09/25/2019   GLUCOSE 87 09/25/2019     The 10-year ASCVD risk  score (Arnett DK, et al., 2019) is: 8.3%   ---------------------------------------------------------------------------------------------------      Medications: Outpatient Medications Prior to Visit  Medication Sig   amLODipine (NORVASC) 10 MG tablet Take 1 tablet (10 mg total) by mouth daily.   ibuprofen (ADVIL,MOTRIN) 200 MG tablet Take 200 mg by mouth every 6 (six) hours as needed.   omeprazole (PRILOSEC) 20 MG capsule omeprazole 20 mg capsule,delayed release   benzonatate (TESSALON) 100 MG capsule Take 1 capsule (100 mg total) by mouth 2 (two) times daily as needed for cough.   fexofenadine (ALLEGRA ALLERGY) 180 MG tablet Take 1 tablet (180 mg total) by mouth daily.   ranitidine (ZANTAC) 300 MG tablet TAKE 1 TABLET BY MOUTH DAILY   sildenafil (VIAGRA) 50 MG tablet Take 1 tablet (50 mg total) by mouth daily as needed for erectile dysfunction.   No facility-administered medications prior to visit.    Review of Systems  Constitutional: Negative.   HENT:  Negative for sore throat.   Respiratory:  Positive for cough. Negative for apnea, choking, chest tightness, shortness of breath, wheezing and stridor.   Cardiovascular: Negative.   Gastrointestinal: Negative.   Neurological:  Negative for dizziness, light-headedness and headaches.      Objective    BP (!) 140/96 (BP Location: Right Arm, Patient Position: Sitting, Cuff Size: Large)  Pulse 63   Temp 97.9 F (36.6 C) (Oral)   Wt 201 lb (91.2 kg)   SpO2 99%   BMI 27.26 kg/m    Physical Exam   General: Appearance:     Well developed, well nourished male in no acute distress  Eyes:    PERRL, conjunctiva/corneas clear, EOM's intact       Lungs:     Clear to auscultation bilaterally, respirations unlabored  Heart:    Normal heart rate. Normal rhythm. No murmurs, rubs, or gallops.    MS:   All extremities are intact.    Neurologic:   Awake, alert, oriented x 3. No apparent focal neurological defect.          Assessment  & Plan     1. Vasomotor rhinitis Recommend he try OTC non-sedating antihistamine  2. Essential hypertension Not at goal. Continue amlodipine and add  valsartan (DIOVAN) 40 MG tablet; Take 1 tablet (40 mg total) by mouth daily.  Dispense: 90 tablet; Refill: 3  - CBC - Comprehensive metabolic panel - Lipid panel  3. Gastroesophageal reflux disease, unspecified whether esophagitis present Well controlled.  Continue current medications.    Other orders  Future Appointments  Date Time Provider [place]  06/30/2021  9:00 AM Caryn Section, Kirstie Peri, MD [facility]    He decline flu vaccine today.       The entirety of the information documented in the History of Present Illness, Review of Systems and Physical Exam were personally obtained by me. Portions of this information were initially documented by the CMA and reviewed by me for thoroughness and accuracy.     Lelon Huh, MD  [facility] [phone] (phone) [phone] (fax)  [place]

## 2021-03-10 ENCOUNTER — Encounter: Payer: Self-pay | Admitting: Family Medicine

## 2021-03-10 ENCOUNTER — Other Ambulatory Visit: Payer: Self-pay

## 2021-03-10 ENCOUNTER — Ambulatory Visit (INDEPENDENT_AMBULATORY_CARE_PROVIDER_SITE_OTHER): Payer: Self-pay | Admitting: Family Medicine

## 2021-03-10 VITALS — BP 140/96 | HR 63 | Temp 97.9°F | Wt 201.0 lb

## 2021-03-10 DIAGNOSIS — J300 Vasomotor rhinitis: Secondary | ICD-10-CM

## 2021-03-10 DIAGNOSIS — K219 Gastro-esophageal reflux disease without esophagitis: Secondary | ICD-10-CM

## 2021-03-10 DIAGNOSIS — I10 Essential (primary) hypertension: Secondary | ICD-10-CM

## 2021-03-10 MED ORDER — VALSARTAN 40 MG PO TABS: 40.0000 mg | ORAL_TABLET | Freq: Every day | ORAL | 3 refills | Status: DC

## 2021-03-10 NOTE — Patient Instructions (Signed)
Please review the attached list of medications and notify my office if there are any errors.   I recommend that you get a flu vaccine this year. Please call our office at [phone] at your earliest convenience to schedule a flu shot.   I strongly recommend a Covid bivalent (omicron) booster if it's been more than 2 months since your last covid booster or infection.    Try a once  a day antihistamine like Zyrtec or Allegra to help prevent the drainage in your throat

## 2021-03-11 LAB — COMPREHENSIVE METABOLIC PANEL
ALT: 27 IU/L (ref 0–44)
AST: 26 IU/L (ref 0–40)
Albumin/Globulin Ratio: 2.2 (ref 1.2–2.2)
Albumin: 4.7 g/dL (ref 3.8–4.9)
Alkaline Phosphatase: 108 IU/L (ref 44–121)
BUN/Creatinine Ratio: 13 (ref 9–20)
BUN: 15 mg/dL (ref 6–24)
Bilirubin Total: 0.6 mg/dL (ref 0.0–1.2)
CO2: 24 mmol/L (ref 20–29)
Calcium: 9.5 mg/dL (ref 8.7–10.2)
Chloride: 105 mmol/L (ref 96–106)
Creatinine, Ser: 1.14 mg/dL (ref 0.76–1.27)
Globulin, Total: 2.1 g/dL (ref 1.5–4.5)
Glucose: 90 mg/dL (ref 70–99)
Potassium: 5.2 mmol/L (ref 3.5–5.2)
Sodium: 140 mmol/L (ref 134–144)
Total Protein: 6.8 g/dL (ref 6.0–8.5)
eGFR: 76 mL/min/{1.73_m2} (ref >59)

## 2021-03-11 LAB — CBC
Hematocrit: 45.5 % (ref 37.5–51.0)
Hemoglobin: 16.2 g/dL (ref 13.0–17.7)
MCH: 30 pg (ref 26.6–33.0)
MCHC: 35.6 g/dL (ref 31.5–35.7)
MCV: 84 fL (ref 79–97)
Platelets: 182 10*3/uL (ref 150–450)
RBC: 5.4 x10E6/uL (ref 4.14–5.80)
RDW: 13.9 % (ref 11.6–15.4)
WBC: 5.2 10*3/uL (ref 3.4–10.8)

## 2021-03-11 LAB — LIPID PANEL
Chol/HDL Ratio: 4.8 ratio (ref 0.0–5.0)
Cholesterol, Total: 176 mg/dL (ref 100–199)
HDL: 37 mg/dL — ABNORMAL LOW (ref >39)
LDL Chol Calc (NIH): 118 mg/dL — ABNORMAL HIGH (ref 0–99)
Triglycerides: 117 mg/dL (ref 0–149)
VLDL Cholesterol Cal: 21 mg/dL (ref 5–40)

## 2021-06-30 ENCOUNTER — Encounter: Payer: Self-pay | Admitting: Family Medicine

## 2021-06-30 ENCOUNTER — Other Ambulatory Visit: Payer: Self-pay

## 2021-06-30 ENCOUNTER — Telehealth: Payer: Self-pay

## 2021-06-30 ENCOUNTER — Ambulatory Visit (INDEPENDENT_AMBULATORY_CARE_PROVIDER_SITE_OTHER): Payer: 59 | Admitting: Family Medicine

## 2021-06-30 VITALS — BP 133/94 | HR 73 | Temp 97.8°F | Resp 14 | Ht 72.0 in | Wt 194.0 lb

## 2021-06-30 DIAGNOSIS — H9313 Tinnitus, bilateral: Secondary | ICD-10-CM

## 2021-06-30 DIAGNOSIS — Z125 Encounter for screening for malignant neoplasm of prostate: Secondary | ICD-10-CM | POA: Diagnosis not present

## 2021-06-30 DIAGNOSIS — Z1211 Encounter for screening for malignant neoplasm of colon: Secondary | ICD-10-CM

## 2021-06-30 DIAGNOSIS — Z1159 Encounter for screening for other viral diseases: Secondary | ICD-10-CM | POA: Diagnosis not present

## 2021-06-30 DIAGNOSIS — I10 Essential (primary) hypertension: Secondary | ICD-10-CM

## 2021-06-30 DIAGNOSIS — Z23 Encounter for immunization: Secondary | ICD-10-CM | POA: Diagnosis not present

## 2021-06-30 DIAGNOSIS — Z0000 Encounter for general adult medical examination without abnormal findings: Secondary | ICD-10-CM

## 2021-06-30 NOTE — Patient Instructions (Addendum)
Please review the attached list of medications and notify my office if there are any errors.   Please bring all of your medications to every appointment so we can make sure that our medication list is the same as yours.   Please contact your eyecare professional to schedule a routine eye exam. I recommend seeing Dr. Keith Nice at [phone] or the [facility] at [phone]  

## 2021-06-30 NOTE — Telephone Encounter (Signed)
CALLED PATIENT NO ANSWER LEFT VOICEMAIL FOR A CALL BACK ? ?

## 2021-06-30 NOTE — Progress Notes (Signed)
I,Scott Obrien,acting as a scribe for Lelon Huh, MD.,have documented all relevant documentation on the behalf of Lelon Huh, MD,as directed by  Lelon Huh, MD while in the presence of Lelon Huh, MD.    Complete physical exam   Patient: Scott Obrien   DOB: Nov 19, 1965   56 y.o. Male  MRN: [id number] Visit Date: 06/30/2021  Today's healthcare provider: Lelon Huh, MD   Chief Complaint  Patient presents with   Annual Exam   Hypertension   Subjective    Scott Obrien is a 56 y.o. male who presents today for a complete physical exam.  He reports consuming a general diet. Home exercise routine includes push ups and squats. He generally feels fairly well. He reports sleeping fairly well. He does have additional problems to discuss today (ringing in ears) HPI  Hypertension, follow-up  BP Readings from Last 3 Encounters:  06/30/21 (!) 133/94  03/10/21 (!) 140/96  09/25/19 (!) 142/90   Wt Readings from Last 3 Encounters:  06/30/21 194 lb (88 kg)  03/10/21 201 lb (91.2 kg)  09/25/19 202 lb (91.6 kg)     He was last seen for hypertension 3 months ago.  BP at that visit was 140/96. Management since that visit includes adding valsartan 72m daily.  He reports good compliance with treatment. He is not having side effects.  He is following a Regular diet. He is exercising. He does not smoke.  Use of agents associated with hypertension: none.   Outside blood pressures are not checked. Symptoms: No chest pain No chest pressure  No palpitations No syncope  No dyspnea No orthopnea  No paroxysmal nocturnal dyspnea No lower extremity edema   Pertinent labs: Lab Results  Component Value Date   CHOL 176 03/10/2021   HDL 37 (L) 03/10/2021   LDLCALC 118 (H) 03/10/2021   TRIG 117 03/10/2021   CHOLHDL 4.8 03/10/2021   Lab Results  Component Value Date   NA 140 03/10/2021   K 5.2 03/10/2021   CREATININE 1.14 03/10/2021   EGFR 76 03/10/2021    GLUCOSE 90 03/10/2021   TSH 3.54 07/05/2011     The 10-year ASCVD risk score (Arnett DK, et al., 2019) is: 7.8%   ---------------------------------------------------------------------------------------------------   Past Medical History:  Diagnosis Date   GERD (gastroesophageal reflux disease)    Past Surgical History:  Procedure Laterality Date   APPENDECTOMY     COLONOSCOPY WITH PROPOFOL N/A 03/11/2016   Procedure: COLONOSCOPY WITH PROPOFOL;  Surgeon: DLucilla Lame MD;  Location[facility]  Service: Endoscopy;  Laterality: N/A;   HERNIA REPAIR  2002   KNEE ARTHROSCOPY Left 01/21/2015   Procedure: ARTHROSCOPY KNEE;  Surgeon: HEarnestine Leys MD;  Location: [facility];  Service: Orthopedics;  Laterality: Left;   POLYPECTOMY  03/11/2016   Procedure: POLYPECTOMY;  Surgeon: DLucilla Lame MD;  [facility]  Service: Endoscopy;;   UPPER GI ENDOSCOPY  08/10/2011   Dr. IDionne Milo Chronic inactive gastritis. No untestinal metapasia. No H. Pylori. 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[employer])  Tobacco Use   Smoking status: Former   Smokeless tobacco: Never   Tobacco comments:    Smoked occasionally,  but quit at age 3  Substance and Sexual Activity   Alcohol use: No    Alcohol/week: 0.0 standard drinks   Drug use: No   Sexual activity: Not on file  Other Topics Concern   Not on file  Social History Narrative   Not on file   Social Determinants of Health   Financial Resource Strain: Not on file  Food Insecurity: Not on file  Transportation Needs: Not on file  Physical Activity: Not on file  Stress: Not on file  Social Connections: Not on file  Intimate Partner Violence: Not on file   Family Status   Relation Name Status   Mother  Alive   Father  Deceased at age 8       Aorta rupture   Sister  Alive   MGM  Deceased at age 74   MGF  Deceased at age 26   [place]  Deceased at age 14   PGF  Deceased at age 11   Other uncles 22   Family History  Problem Relation Age of Onset   Diverticulitis Mother    Stroke Maternal Grandmother    Hodgkin's lymphoma Paternal Grandfather    Heart disease Other        had CABG   No Known Allergies  Patient Care Team: Birdie Sons, MD as PCP - General (Family Medicine)   Medications: Outpatient Medications Prior to Visit  Medication Sig   amLODipine (NORVASC) 10 MG tablet Take 1 tablet (10 mg total) by mouth daily.   fexofenadine (ALLEGRA ALLERGY) 180 MG tablet Take 1 tablet (180 mg total) by mouth daily.   ibuprofen (ADVIL,MOTRIN) 200 MG tablet Take 200 mg by mouth every 6 (six) hours as needed.   omeprazole (PRILOSEC) 20 MG capsule omeprazole 20 mg capsule,delayed release   valsartan (DIOVAN) 40 MG tablet Take 1 tablet (40 mg total) by mouth daily.   No facility-administered medications prior to visit.    Review of Systems  Constitutional:  Negative for appetite change, chills, fatigue and fever.  HENT:  Positive for tinnitus. Negative for congestion, ear pain, hearing loss, nosebleeds and trouble swallowing.   Eyes:  Negative for pain and visual disturbance.  Respiratory:  Negative for cough, chest tightness, shortness of breath and wheezing.   Cardiovascular:  Negative for chest pain, palpitations and leg swelling.  Gastrointestinal:  Negative for abdominal pain, blood in stool, constipation, diarrhea, nausea and vomiting.  Endocrine: Negative for polydipsia and polyphagia.  Genitourinary:  Positive for frequency. Negative for dysuria and flank pain.  Musculoskeletal:  Negative for arthralgias, back pain, joint swelling, myalgias and neck stiffness.  Skin:  Negative for color change, rash and wound.  Neurological:  Negative for  dizziness, tremors, seizures, speech difficulty, weakness, light-headedness and headaches.  Psychiatric/Behavioral:  Negative for behavioral problems, confusion, decreased concentration, dysphoric mood and sleep disturbance. The patient is not nervous/anxious.   All other systems reviewed and are negative.    Objective    BP (!) 133/94 (BP Location: Left Arm, Patient Position: Sitting, Cuff Size: Large)    Pulse 73    Temp 97.8 F (36.6 C) (Oral)    Resp 14    Ht 6' (1.829 m)    Wt 194 lb (88 kg)    SpO2 97% Comment: room air   BMI 26.31 kg/m    Physical Exam   General Appearance:     Well developed, well nourished male. Alert, cooperative, in no acute distress, appears stated age  Head:  Normocephalic, without obvious abnormality, atraumatic  Eyes:    PERRL, conjunctiva/corneas clear, EOM's intact, fundi    benign, both eyes       Ears:    Normal TM's and external ear canals, both ears  Neck:   Supple, symmetrical, trachea midline, no adenopathy;       thyroid:  No enlargement/tenderness/nodules; no carotid   bruit or JVD  Back:     Symmetric, no curvature, ROM normal, no CVA tenderness  Lungs:     Clear to auscultation bilaterally, respirations unlabored  Chest wall:    No tenderness or deformity  Heart:    Normal heart rate. Normal rhythm. No murmurs, rubs, or gallops.  S1 and S2 normal  Abdomen:     Soft, non-tender, bowel sounds active all four quadrants,    no masses, no organomegaly  Genitalia:    deferred  Rectal:    deferred  Extremities:   All extremities are intact. No cyanosis or edema  Pulses:   2+ and symmetric all extremities  Skin:   Skin color, texture, turgor normal, no rashes or lesions  Lymph nodes:   Cervical, supraclavicular, and axillary nodes normal  Neurologic:   CNII-XII intact. Normal strength, sensation and reflexes      throughout     Last depression screening scores PHQ 2/9 Scores 06/30/2021 03/10/2021 12/09/2020  PHQ - 2 Score 0 0 0  PHQ- 9  Score 0 2 -   Last fall risk screening Fall Risk  06/30/2021  Falls in the past year? 0  Number falls in past yr: 0  Injury with Fall? 0  Risk for fall due to : No Fall Risks  Follow up Falls evaluation completed   Last Audit-C alcohol use screening Alcohol Use Disorder Test (AUDIT) 06/30/2021  1. How often do you have a drink containing alcohol? 0  2. How many drinks containing alcohol do you have on a typical day when you are drinking? 0  3. How often do you have six or more drinks on one occasion? 0  AUDIT-C Score 0   A score of 3 or more in women, and 4 or more in men indicates increased risk for alcohol abuse, EXCEPT if all of the points are from question 1   No results found for any visits on 06/30/21.  Assessment & Plan    Routine Health Maintenance and Physical Exam  Exercise Activities and Dietary recommendations  Goals   None     Immunization History  Administered Date(s) Administered   Moderna Sars-Covid-2 Vaccination 02/28/2020, 03/27/2020   Tdap 02/21/2007, 06/30/2021    Health Maintenance  Topic Date Due   HIV Screening  Never done   Hepatitis C Screening  Never done   Zoster Vaccines- Shingrix (1 of 2) Never done   COVID-19 Vaccine (3 - Moderna risk series) 04/24/2020   COLONOSCOPY (Pts 45-64yr Insurance coverage will need to be confirmed)  03/11/2021   INFLUENZA VACCINE  08/20/2021 (Originally 12/21/2020)   TETANUS/TDAP  07/01/2031   HPV VACCINES  Aged Out    Discussed health benefits of physical activity, and encouraged him to engage in regular exercise appropriate for his age and condition.  1. Colon cancer screening History of adenomatous polyps in 2017 - Ambulatory referral to gastroenterology for colonoscopy  2. Need for tetanus, diphtheria, and acellular pertussis (Tdap) vaccine in patient of adolescent age or older  - Administer Tetanus-diphtheria-acellular pertussis (Tdap) vaccine  3. Need for hepatitis C screening test  - Hepatitis C  antibody  4. Prostate cancer screening  - PSA Total (Reflex To Free) ([facility] only)  5. Tinnitus of both ears Normal ear exam.   6. Essential hypertension Doing well on current medications. DBP not at goal. Consider increasing valsartan unless potassium is elevated.  - EKG 12-Lead - Renal function panel  Future Appointments  Date Time Provider [place]  12/29/2021  8:00 AM Caryn Section, Kirstie Peri, MD [facility]       The entirety of the information documented in the History of Present Illness, Review of Systems and Physical Exam were personally obtained by me. Portions of this information were initially documented by the CMA and reviewed by me for thoroughness and accuracy.     Lelon Huh, MD  [facility] [phone] (phone) [phone] (fax)  [place]

## 2021-07-01 ENCOUNTER — Telehealth: Payer: Self-pay

## 2021-07-01 LAB — HEPATITIS C ANTIBODY: Hep C Virus Ab: <0.1 s/co ratio (ref 0.0–0.9)

## 2021-07-01 LAB — RENAL FUNCTION PANEL
Albumin: 4.7 g/dL (ref 3.8–4.9)
BUN/Creatinine Ratio: 18 (ref 9–20)
BUN: 21 mg/dL (ref 6–24)
CO2: 24 mmol/L (ref 20–29)
Calcium: 9.4 mg/dL (ref 8.7–10.2)
Chloride: 104 mmol/L (ref 96–106)
Creatinine, Ser: 1.2 mg/dL (ref 0.76–1.27)
Glucose: 92 mg/dL (ref 70–99)
Phosphorus: 3.5 mg/dL (ref 2.8–4.1)
Potassium: 5.1 mmol/L (ref 3.5–5.2)
Sodium: 141 mmol/L (ref 134–144)
eGFR: 71 mL/min/{1.73_m2} (ref >59)

## 2021-07-01 LAB — PSA TOTAL (REFLEX TO FREE): Prostate Specific Ag, Serum: 0.3 ng/mL (ref 0.0–4.0)

## 2021-07-01 NOTE — Telephone Encounter (Signed)
CALLED PATIENT NO ANSWER LEFT VOICEMAIL FOR A CALL BACK °Letter sent °

## 2021-07-25 ENCOUNTER — Other Ambulatory Visit: Payer: Self-pay | Admitting: Family Medicine

## 2021-07-25 DIAGNOSIS — I10 Essential (primary) hypertension: Secondary | ICD-10-CM

## 2021-12-29 ENCOUNTER — Ambulatory Visit: Payer: 59 | Admitting: Family Medicine

## 2022-01-10 ENCOUNTER — Ambulatory Visit: Payer: 59 | Admitting: Family Medicine

## 2022-01-10 NOTE — Progress Notes (Deleted)
      Established patient visit   Patient: Scott Obrien   DOB: [birth year]   56 y.o. Male  MRN: [id number] Visit Date: 01/10/2022  Today's healthcare provider: Lelon Huh, MD   No chief complaint on file.  Subjective    Hypertension, follow-up  BP Readings from Last 3 Encounters:  06/30/21 (!) 133/94  03/10/21 (!) 140/96  09/25/19 (!) 142/90   Wt Readings from Last 3 Encounters:  06/30/21 194 lb (88 kg)  03/10/21 201 lb (91.2 kg)  09/25/19 202 lb (91.6 kg)     He was last seen for hypertension 6 months ago.  BP at that visit was 133/94. Management since that visit includes: Continue current medication for the time being. Strictly avoid salty foods in your diet and try to exercise regularity.   He reports {excellent/good/fair/poor:19665} compliance with treatment. He {is/is not:9024} having side effects.  He is following a {diet:21022986} diet. He {is/is not:9024} exercising. He {does/does not:200015} smoke.  Use of agents associated with hypertension: {bp agents assoc with hypertension:511::"none"}.   Outside blood pressures are Symptoms: {Yes/No:20286} chest pain {Yes/No:20286} chest pressure  {Yes/No:20286} palpitations {Yes/No:20286} syncope  {Yes/No:20286} dyspnea {Yes/No:20286} orthopnea  {Yes/No:20286} paroxysmal nocturnal dyspnea {Yes/No:20286} lower extremity edema   Pertinent labs Lab Results  Component Value Date   CHOL 176 03/10/2021   HDL 37 (L) 03/10/2021   LDLCALC 118 (H) 03/10/2021   TRIG 117 03/10/2021   CHOLHDL 4.8 03/10/2021   Lab Results  Component Value Date   NA 141 06/30/2021   K 5.1 06/30/2021   CREATININE 1.20 06/30/2021   EGFR 71 06/30/2021   GLUCOSE 92 06/30/2021   TSH 3.54 07/05/2011     The 10-year ASCVD risk score (Arnett DK, et al., 2019) is: 8.5%  ---------------------------------------------------------------------------------------------------   Medications: Outpatient Medications Prior to Visit  Medication Sig    amLODipine (NORVASC) 10 MG tablet TAKE 1 TABLET BY MOUTH EVERY DAY   fexofenadine (ALLEGRA ALLERGY) 180 MG tablet Take 1 tablet (180 mg total) by mouth daily.   ibuprofen (ADVIL,MOTRIN) 200 MG tablet Take 200 mg by mouth every 6 (six) hours as needed.   omeprazole (PRILOSEC) 20 MG capsule omeprazole 20 mg capsule,delayed release   valsartan (DIOVAN) 40 MG tablet Take 1 tablet (40 mg total) by mouth daily.   No facility-administered medications prior to visit.    Review of Systems  {Labs  Heme  Chem  Endocrine  Serology  Results Review (optional):23779}   Objective    There were no vitals taken for this visit. {Show previous vital signs (optional):23777}  Physical Exam  ***  No results found for any visits on 01/10/22.  Assessment & Plan     ***  No follow-ups on file.      {provider attestation***:1}   Lelon Huh, MD  [facility] [phone] (phone) [phone] (fax)  [place]

## 2022-02-11 ENCOUNTER — Ambulatory Visit (INDEPENDENT_AMBULATORY_CARE_PROVIDER_SITE_OTHER): Payer: 59 | Admitting: Family Medicine

## 2022-02-11 ENCOUNTER — Encounter: Payer: Self-pay | Admitting: Family Medicine

## 2022-02-11 VITALS — BP 130/88 | HR 60 | Temp 97.9°F | Resp 16 | Wt 197.0 lb

## 2022-02-11 DIAGNOSIS — Z86010 Personal history of colonic polyps: Secondary | ICD-10-CM

## 2022-02-11 DIAGNOSIS — Z23 Encounter for immunization: Secondary | ICD-10-CM

## 2022-02-11 DIAGNOSIS — I10 Essential (primary) hypertension: Secondary | ICD-10-CM

## 2022-02-11 DIAGNOSIS — Z1211 Encounter for screening for malignant neoplasm of colon: Secondary | ICD-10-CM | POA: Diagnosis not present

## 2022-02-11 NOTE — Progress Notes (Unsigned)
I,Roshena L Chambers,acting as a scribe for Lelon Huh, MD.,have documented all relevant documentation on the behalf of Lelon Huh, MD,as directed by  Lelon Huh, MD while in the presence of Lelon Huh, MD.   Established patient visit   Patient: Scott Obrien   DOB: [birth year]   56 y.o. Male  MRN: [id number] Visit Date: 02/11/2022  Today's healthcare provider: Lelon Huh, MD   Chief Complaint  Patient presents with   Hypertension   Subjective    HPI  Hypertension, follow-up  BP Readings from Last 3 Encounters:  02/11/22 (!) 125/92  06/30/21 (!) 133/94  03/10/21 (!) 140/96   Wt Readings from Last 3 Encounters:  02/11/22 197 lb (89.4 kg)  06/30/21 194 lb (88 kg)  03/10/21 201 lb (91.2 kg)     He was last seen for hypertension 7 months ago.  BP at that visit was 133/94. Management since that visit includes advising patient to continue the same dose of  blood pressure medications for the time being. Work on strictly avoid salty foods in diet and trying to exercise regularly.  He reports good compliance with treatment. He is not having side effects. He is following a Regular diet. He is exercising. He does not smoke.  Use of agents associated with hypertension: none.   Outside blood pressures are checked occasionally. Symptoms: No chest pain No chest pressure  No palpitations No syncope  No dyspnea No orthopnea  No paroxysmal nocturnal dyspnea No lower extremity edema   Patient reports having 3 separate episodes last month of lightheadedness and a racing heart. Symptoms resolved after a couple of minutes. Each episodes occurred in the mornings.  Pertinent labs Lab Results  Component Value Date   CHOL 176 03/10/2021   HDL 37 (L) 03/10/2021   LDLCALC 118 (H) 03/10/2021   TRIG 117 03/10/2021   CHOLHDL 4.8 03/10/2021   Lab Results  Component Value Date   NA 141 06/30/2021   K 5.1 06/30/2021   CREATININE 1.20 06/30/2021   EGFR 71 06/30/2021    GLUCOSE 92 06/30/2021   TSH 3.54 07/05/2011     The 10-year ASCVD risk score (Arnett DK, et al., 2019) is: 7.7%  ---------------------------------------------------------------------------------------------------   Medications: Outpatient Medications Prior to Visit  Medication Sig   amLODipine (NORVASC) 10 MG tablet TAKE 1 TABLET BY MOUTH EVERY DAY   fexofenadine (ALLEGRA ALLERGY) 180 MG tablet Take 1 tablet (180 mg total) by mouth daily.   ibuprofen (ADVIL,MOTRIN) 200 MG tablet Take 200 mg by mouth every 6 (six) hours as needed.   omeprazole (PRILOSEC) 20 MG capsule omeprazole 20 mg capsule,delayed release   valsartan (DIOVAN) 40 MG tablet Take 1 tablet (40 mg total) by mouth daily.   No facility-administered medications prior to visit.    Review of Systems  Constitutional:  Negative for appetite change, chills and fever.  Respiratory:  Negative for chest tightness, shortness of breath and wheezing.   Cardiovascular:  Negative for chest pain and palpitations.  Gastrointestinal:  Negative for abdominal pain, nausea and vomiting.  Neurological:  Positive for light-headedness.    {Labs  Heme  Chem  Endocrine  Serology  Results Review (optional):23779}   Objective    BP 130/88   Pulse 60   Temp 97.9 F (36.6 C) (Oral)   Resp 16   Wt 197 lb (89.4 kg)   SpO2 97% Comment: room air  BMI 26.72 kg/m  {Show previous vital signs (optional):23777}  Physical Exam  General: Appearance:    Well developed, well nourished male in no acute distress  Eyes:    PERRL, conjunctiva/corneas clear, EOM's intact       Lungs:     Clear to auscultation bilaterally, respirations unlabored  Heart:    Normal heart rate. Normal rhythm. No murmurs, rubs, or gallops.    MS:   All extremities are intact.    Neurologic:   Awake, alert, oriented x 3. No apparent focal neurological defect.         Assessment & Plan     1. Essential hypertension Relative well controlled. Continue current  medications.    2. Need for shingles vaccine  - Administer Zoster, Recombinant (Shingrix) Vaccine  3. History of adenomatous polyp of colon  4. Colon cancer screening  - Ambulatory referral to gastroenterology for colonoscopy   He declined flu vaccine today.      The entirety of the information documented in the History of Present Illness, Review of Systems and Physical Exam were personally obtained by me. Portions of this information were initially documented by the CMA and reviewed by me for thoroughness and accuracy.     Lelon Huh, MD  [facility] [phone] (phone) [phone] (fax)  [place]

## 2022-02-11 NOTE — Patient Instructions (Signed)
.   Please review the attached list of medications and notify my office if there are any errors.   . Please bring all of your medications to every appointment so we can make sure that our medication list is the same as yours.   

## 2022-02-16 ENCOUNTER — Other Ambulatory Visit: Payer: Self-pay

## 2022-02-16 ENCOUNTER — Telehealth: Payer: Self-pay

## 2022-02-16 DIAGNOSIS — Z86010 Personal history of colonic polyps: Secondary | ICD-10-CM

## 2022-02-16 MED ORDER — NA SULFATE-K SULFATE-MG SULF 17.5-3.13-1.6 GM/177ML PO SOLN: 1.0000 | Freq: Once | ORAL | 0 refills | Status: AC

## 2022-02-16 NOTE — Telephone Encounter (Signed)
Gastroenterology Pre-Procedure Review  Request Date: 04/19/22 Requesting Physician: Dr. Allen Norris  PATIENT REVIEW QUESTIONS: The patient responded to the following health history questions as indicated:    1. Are you having any GI issues? no 2. Do you have a personal history of Polyps? yes (03/18/16 colonoscopy performed by Dr. Allen Norris noted polyps) 3. Do you have a family history of Colon Cancer or Polyps? no 4. Diabetes Mellitus? no 5. Joint replacements in the past 12 months?no 6. Major health problems in the past 3 months?no 7. Any artificial heart valves, MVP, or defibrillator?no    MEDICATIONS & ALLERGIES:    Patient reports the following regarding taking any anticoagulation/antiplatelet therapy:   Plavix, Coumadin, Eliquis, Xarelto, Lovenox, Pradaxa, Brilinta, or Effient? no Aspirin? no  Patient confirms/reports the following medications:  Current Outpatient Medications  Medication Sig Dispense Refill   amLODipine (NORVASC) 10 MG tablet TAKE 1 TABLET BY MOUTH EVERY DAY 90 tablet 4   fexofenadine (ALLEGRA ALLERGY) 180 MG tablet Take 1 tablet (180 mg total) by mouth daily. 10 tablet 1   ibuprofen (ADVIL,MOTRIN) 200 MG tablet Take 200 mg by mouth every 6 (six) hours as needed.     omeprazole (PRILOSEC) 20 MG capsule omeprazole 20 mg capsule,delayed release     valsartan (DIOVAN) 40 MG tablet Take 1 tablet (40 mg total) by mouth daily. 90 tablet 3   No current facility-administered medications for this visit.    Patient confirms/reports the following allergies:  No Known Allergies  No orders of the defined types were placed in this encounter.   AUTHORIZATION INFORMATION Primary Insurance: 1D#: Group #:  Secondary Insurance: 1D#: Group #:  SCHEDULE INFORMATION: Date: 04/19/22 Time: Location: [place]

## 2022-03-29 ENCOUNTER — Other Ambulatory Visit: Payer: Self-pay | Admitting: Family Medicine

## 2022-04-12 ENCOUNTER — Telehealth: Payer: Self-pay

## 2022-04-12 NOTE — Telephone Encounter (Signed)
Pt contacted office to reschedule his colonoscopy to Feb of next year.  Colonoscopy has been rescheduled to 06/28/22 with Dr. Allen Norris at [facility].  Trish in Endo informed of date change.  Thanks,  [place], [place]

## 2022-06-28 ENCOUNTER — Other Ambulatory Visit: Payer: Self-pay

## 2022-06-28 ENCOUNTER — Ambulatory Visit: Payer: 59 | Admitting: Certified Registered"

## 2022-06-28 ENCOUNTER — Encounter
Admission: RE | Disposition: A | Discharge status: Home / Self Care | Payer: Self-pay | Source: Home / Self Care | Attending: Gastroenterology

## 2022-06-28 ENCOUNTER — Ambulatory Visit
Admission: RE | Admit: 2022-06-28 | Discharge: 2022-06-28 | Disposition: A | Discharge status: Home / Self Care | Payer: 59 | Attending: Gastroenterology | Admitting: Gastroenterology

## 2022-06-28 ENCOUNTER — Encounter: Payer: Self-pay | Admitting: Gastroenterology

## 2022-06-28 DIAGNOSIS — Z1211 Encounter for screening for malignant neoplasm of colon: Secondary | ICD-10-CM | POA: Insufficient documentation

## 2022-06-28 DIAGNOSIS — Z860100 Personal history of colon polyps, unspecified: Secondary | ICD-10-CM

## 2022-06-28 DIAGNOSIS — K5730 Diverticulosis of large intestine without perforation or abscess without bleeding: Secondary | ICD-10-CM | POA: Insufficient documentation

## 2022-06-28 DIAGNOSIS — K641 Second degree hemorrhoids: Secondary | ICD-10-CM | POA: Insufficient documentation

## 2022-06-28 DIAGNOSIS — Z8379 Family history of other diseases of the digestive system: Secondary | ICD-10-CM | POA: Insufficient documentation

## 2022-06-28 DIAGNOSIS — Z87891 Personal history of nicotine dependence: Secondary | ICD-10-CM | POA: Diagnosis not present

## 2022-06-28 HISTORY — DX: Essential (primary) hypertension: I10

## 2022-06-28 HISTORY — PX: COLONOSCOPY WITH PROPOFOL: SHX5780

## 2022-06-28 SURGERY — COLONOSCOPY WITH PROPOFOL
Anesthesia: General

## 2022-06-28 MED ORDER — LACTATED RINGERS IV SOLN: INTRAVENOUS | Status: DC | PRN

## 2022-06-28 MED ORDER — PROPOFOL 500 MG/50ML IV EMUL
INTRAVENOUS | Status: DC | PRN
  Administered 2022-06-28: 120 ug/kg/min via INTRAVENOUS
  Administered 2022-06-28: 100 mg via INTRAVENOUS

## 2022-06-28 MED ORDER — SODIUM CHLORIDE 0.9 % IV SOLN: INTRAVENOUS | Status: DC

## 2022-06-28 NOTE — Transfer of Care (Signed)
Immediate Anesthesia Transfer of Care Note  Patient: Scott Obrien  Procedure(s) Performed: COLONOSCOPY WITH PROPOFOL  Patient Location: PACU  Anesthesia Type:MAC  Level of Consciousness: awake  Airway & Oxygen Therapy: Patient Spontanous Breathing  Post-op Assessment: Report given to RN  Post vital signs: Reviewed  Last Vitals:  Vitals Value Taken Time  BP 112/81 06/28/22 0806  Temp 36 C 06/28/22 0805  Pulse 67 06/28/22 0806  Resp 17 06/28/22 0808  SpO2 99 % 06/28/22 0806  Vitals shown include unvalidated device data.  Last Pain:  Vitals:   06/28/22 0805  TempSrc: Temporal  PainSc: 0-No pain         Complications: No notable events documented.

## 2022-06-28 NOTE — Anesthesia Preprocedure Evaluation (Signed)
Anesthesia Evaluation  Patient identified by MRN, date of birth, ID band Patient awake    Reviewed: Allergy & Precautions, H&P , NPO status , Patient's Chart, lab work & pertinent test results, reviewed documented beta blocker date and time   History of Anesthesia Complications Negative for: history of anesthetic complications  Airway Mallampati: II  TM Distance: >3 FB Neck ROM: full    Dental  (+) Dental Advidsory Given, Caps, Teeth Intact   Pulmonary neg pulmonary ROS, former smoker   Pulmonary exam normal breath sounds clear to auscultation       Cardiovascular Exercise Tolerance: Good hypertension, (-) angina (-) Past MI and (-) Cardiac Stents Normal cardiovascular exam(-) dysrhythmias (-) Valvular Problems/Murmurs Rhythm:regular Rate:Normal     Neuro/Psych negative neurological ROS  negative psych ROS   GI/Hepatic Neg liver ROS,GERD  ,,  Endo/Other  negative endocrine ROS    Renal/GU negative Renal ROS  negative genitourinary   Musculoskeletal   Abdominal   Peds  Hematology negative hematology ROS (+)   Anesthesia Other Findings Past Medical History: No date: GERD (gastroesophageal reflux disease) No date: Hypertension   Reproductive/Obstetrics negative OB ROS                             Anesthesia Physical Anesthesia Plan  ASA: 2  Anesthesia Plan: General   Post-op Pain Management:    Induction: Intravenous  PONV Risk Score and Plan: 2 and Propofol infusion and TIVA  Airway Management Planned: Natural Airway and Nasal Cannula  Additional Equipment:   Intra-op Plan:   Post-operative Plan:   Informed Consent: I have reviewed the patients History and Physical, chart, labs and discussed the procedure including the risks, benefits and alternatives for the proposed anesthesia with the patient or authorized representative who has indicated his/her understanding and  acceptance.     Dental Advisory Given  Plan Discussed with: Anesthesiologist, CRNA and Surgeon  Anesthesia Plan Comments:        Anesthesia Quick Evaluation

## 2022-06-28 NOTE — Op Note (Signed)
[facility] Gastroenterology Patient Name: Scott Obrien Procedure Date: 06/28/2022 7:46 AM MRN: [id number] Account #: [id number] Date of Birth: 1965/09/13 Admit Type: Outpatient Age: 57 Room: [facility] ENDO ROOM 4 Gender: Male Note Status: Finalized Instrument Name: Park Meo [id number] Procedure:             Colonoscopy Indications:           High risk colon cancer surveillance: Personal history                         of colonic polyps Providers:             Lucilla Lame MD, MD Referring MD:          Kirstie Peri. Caryn Section, MD (Referring MD) Medicines:             Propofol per Anesthesia Complications:         No immediate complications. Procedure:             Pre-Anesthesia Assessment:                        - Prior to the procedure, a History and Physical was                         performed, and patient medications and allergies were                         reviewed. The patient's tolerance of previous                         anesthesia was also reviewed. The risks and benefits                         of the procedure and the sedation options and risks                         were discussed with the patient. All questions were                         answered, and informed consent was obtained. Prior                         Anticoagulants: The patient has taken no anticoagulant                         or antiplatelet agents. ASA Grade Assessment: II - A                         patient with mild systemic disease. After reviewing                         the risks and benefits, the patient was deemed in                         satisfactory condition to undergo the procedure.                        After obtaining informed consent, the colonoscope was  passed under direct vision. Throughout the procedure,                         the patient's blood pressure, pulse, and oxygen                         saturations were monitored continuously. The                          Colonoscope was introduced through the anus and                         advanced to the the cecum, identified by appendiceal                         orifice and ileocecal valve. The colonoscopy was                         performed without difficulty. The patient tolerated                         the procedure well. The quality of the bowel                         preparation was excellent. Findings:      The perianal and digital rectal examinations were normal.      Multiple small-mouthed diverticula were found in the sigmoid colon and       descending colon.      Non-bleeding internal hemorrhoids were found during retroflexion. The       hemorrhoids were Grade II (internal hemorrhoids that prolapse but reduce       spontaneously). Impression:            - Diverticulosis in the sigmoid colon and in the                         descending colon.                        - Non-bleeding internal hemorrhoids.                        - No specimens collected. Recommendation:        - Discharge patient to home.                        - Resume previous diet.                        - Continue present medications.                        - Repeat colonoscopy in 7 years for surveillance. Procedure Code(s):     --- Professional ---                        [phone], Colonoscopy, flexible; diagnostic, including                         collection of specimen(s) by brushing or washing, when  performed (separate procedure) Diagnosis Code(s):     --- Professional ---                        Z86.010, Personal history of colonic polyps CPT copyright 2022 American Medical Association. All rights reserved. The codes documented in this report are preliminary and upon coder review may  be revised to meet current compliance requirements. Lucilla Lame MD, MD 06/28/2022 8:03:45 AM This report has been signed electronically. Number of Addenda: 0 Note Initiated On: 06/28/2022 7:46  AM Scope Withdrawal Time: 0 hours 8 minutes 14 seconds  Total Procedure Duration: 0 hours 9 minutes 53 seconds  Estimated Blood Loss:  Estimated blood loss: none.      [facility]

## 2022-06-28 NOTE — H&P (Signed)
Lucilla Lame, MD [address] [place], [place] [address] Phone:[phone] Fax : [phone]  Primary Care Physician:  Birdie Sons, MD Primary Gastroenterologist:  Dr. Allen Norris  Pre-Procedure History & Physical: HPI:  Scott Obrien is a 57 y.o. male is here for an colonoscopy.   Past Medical History:  Diagnosis Date   GERD (gastroesophageal reflux disease)    Hypertension     Past Surgical History:  Procedure Laterality Date   APPENDECTOMY     COLONOSCOPY WITH PROPOFOL N/A 03/11/2016   Procedure: COLONOSCOPY WITH PROPOFOL;  Surgeon: Lucilla Lame, MD;  Location: [place];  Service: Endoscopy;  Laterality: N/A;   HERNIA REPAIR  2002   KNEE ARTHROSCOPY Left 01/21/2015   Procedure: ARTHROSCOPY KNEE;  Surgeon: Earnestine Leys, MD;  Location: [facility];  Service: Orthopedics;  Laterality: Left;   POLYPECTOMY  03/11/2016   Procedure: POLYPECTOMY;  Surgeon: Lucilla Lame, MD;  Location: [place];  Service: Endoscopy;;   UPPER GI ENDOSCOPY  08/10/2011   Dr. Dionne Milo, Chronic inactive gastritis. No untestinal metapasia. No H. Pylori. Normal esophagus    Prior to Admission medications   Medication Sig Start Date End Date Taking? Authorizing Provider  amLODipine (NORVASC) 10 MG tablet TAKE 1 TABLET BY MOUTH EVERY DAY 07/26/21  Yes Birdie Sons, MD  fexofenadine [facility] ALLERGY) 180 MG tablet Take 1 tablet (180 mg total) by mouth daily. 12/09/20  Yes Vigg, Avanti, MD  ibuprofen (ADVIL,MOTRIN) 200 MG tablet Take 200 mg by mouth every 6 (six) hours as needed.   Yes [provider]  omeprazole (PRILOSEC) 20 MG capsule omeprazole 20 mg capsule,delayed release   Yes [provider]  valsartan (DIOVAN) 40 MG tablet TAKE 1 TABLET BY MOUTH EVERY DAY 03/29/22  Yes Birdie Sons, MD    Allergies as of 02/16/2022   (No Known Allergies)    Family History  Problem Relation Age of Onset   Diverticulitis Mother    Stroke Maternal  Grandmother    Hodgkin's lymphoma Paternal Grandfather    Heart disease Other        had CABG    Social History   Socioeconomic History   Marital status: Married    Spouse name: Not on file   Number of children: 4   Years of education: Not on file   Highest education level: Not on file  Occupational History   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[employer])  Tobacco Use   Smoking status: Former   Smokeless tobacco: Never   Tobacco comments:    Smoked occasionally, but quit at age 50  Substance and Sexual Activity   Alcohol use: No    Alcohol/week: 0.0 standard drinks of alcohol   Drug use: No   Sexual activity: Not on file  Other Topics Concern   Not on file  Social History Narrative   Not on file   Social Determinants of Health   Financial Resource Strain: Not on file  Food Insecurity: Not on file  Transportation Needs: Not on file  Physical Activity: Not on file  Stress: Not on file  Social Connections: Not on file  Intimate Partner Violence: Not on file    Review of Systems: See HPI, otherwise negative ROS  Physical Exam: BP (!) 138/97   Pulse 68   Temp (!) 96.8 F (36 C) (Temporal)   Resp 16   Ht 6' (1.829 m)   Wt 88.9 kg   SpO2 97%  BMI 26.58 kg/m  General:   Alert,  pleasant and cooperative in NAD Head:  Normocephalic and atraumatic. Neck:  Supple; no masses or thyromegaly. Lungs:  Clear throughout to auscultation.    Heart:  Regular rate and rhythm. Abdomen:  Soft, nontender and nondistended. Normal bowel sounds, without guarding, and without rebound.   Neurologic:  Alert and  oriented x4;  grossly normal neurologically.  Impression/Plan: Scott Obrien is here for an colonoscopy to be performed for a history of adenomatous polyps on   Risks, benefits, limitations, and alternatives regarding  colonoscopy have been reviewed with the patient.  Questions have been answered.  All parties agreeable.   Lucilla Lame, MD  06/28/2022,  7:29 AM

## 2022-06-29 ENCOUNTER — Encounter: Payer: Self-pay | Admitting: Gastroenterology

## 2022-07-05 ENCOUNTER — Ambulatory Visit (INDEPENDENT_AMBULATORY_CARE_PROVIDER_SITE_OTHER): Payer: 59 | Admitting: Family Medicine

## 2022-07-05 VITALS — BP 129/91 | HR 65 | Temp 97.9°F | Ht 72.0 in | Wt 203.0 lb

## 2022-07-05 DIAGNOSIS — Z125 Encounter for screening for malignant neoplasm of prostate: Secondary | ICD-10-CM | POA: Diagnosis not present

## 2022-07-05 DIAGNOSIS — I10 Essential (primary) hypertension: Secondary | ICD-10-CM | POA: Diagnosis not present

## 2022-07-05 DIAGNOSIS — K219 Gastro-esophageal reflux disease without esophagitis: Secondary | ICD-10-CM

## 2022-07-05 DIAGNOSIS — R000 Tachycardia, unspecified: Secondary | ICD-10-CM

## 2022-07-05 DIAGNOSIS — Z0000 Encounter for general adult medical examination without abnormal findings: Secondary | ICD-10-CM

## 2022-07-05 NOTE — Progress Notes (Signed)
Ar[place]ina P[place],acting as a scribe for Lelon Huh, MD.,have documented all relevant documentation on the behalf of Lelon Huh, MD,as directed by  Lelon Huh, MD while in the presence of Lelon Huh, MD.    Complete physical exam   Patient: Scott Obrien   DOB: September 05, 1965   57 y.o. Male  MRN: [id number] Visit Date: 07/05/2022  Today's healthcare provider: Lelon Huh, MD    Subjective    Scott Obrien is a 57 y.o. male who presents today for a complete physical exam.  He reports consuming a general diet. Home exercise routine includes stretching. He generally feels well. He reports sleeping well. He does have additional problems to discuss today.   Patient states that recently he has had a few episodes of feeling lightheaded 4-5 minutes.  He thinks his heart is beating faster than his normal. No new medications, no diet changes, no change in caffeine intake.  Past Medical History:  Diagnosis Date   GERD (gastroesophageal reflux disease)    Hypertension    Past Surgical History:  Procedure Laterality Date   APPENDECTOMY     COLONOSCOPY WITH PROPOFOL N/A 03/11/2016   Procedure: COLONOSCOPY WITH PROPOFOL;  Surgeon: Lucilla Lame, MD;  Location: [place];  Service: Endoscopy;  Laterality: N/A;   COLONOSCOPY WITH PROPOFOL N/A 06/28/2022   Procedure: COLONOSCOPY WITH PROPOFOL;  Surgeon: Lucilla Lame, MD;  Location: [facility] ENDOSCOPY;  Service: Endoscopy;  Laterality: N/A;   HERNIA REPAIR  2002   KNEE ARTHROSCOPY Left 01/21/2015   Procedure: ARTHROSCOPY KNEE;  Surgeon: Earnestine Leys, MD;  Location: [facility];  Service: Orthopedics;  Laterality: Left;   POLYPECTOMY  03/11/2016   Procedure: POLYPECTOMY;  Surgeon: Lucilla Lame, MD;  Location: [place];  Service: Endoscopy;;   UPPER GI ENDOSCOPY  08/10/2011   Dr. Dionne Milo, Chronic inactive gastritis. No untestinal metapasia. No H. Pylori. 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[employer])  Tobacco Use   Smoking status: Former   Smokeless tobacco: Never   Tobacco comments:    Smoked occasionally, but quit at age 32  Substance and Sexual Activity   Alcohol use: No    Alcohol/week: 0.0 standard drinks of alcohol   Drug use: No   Sexual activity: Not on file  Other Topics Concern   Not on file  Social History Narrative   Not on file   Social Determinants of Health   Financial Resource Strain: Not on file  Food Insecurity: Not on file  Transportation Needs: Not on file  Physical Activity: Not on file  Stress: Not on file  Social Connections: Not on file  Intimate Partner Violence: Not on file   Family Status  Relation Name Status   Mother  Alive   Father  Deceased at age 94       Aorta rupture   Sister  Alive   MGM  Deceased at age 9   MGF  Deceased at age 14   PGM  Deceased at age 69   PGF  Deceased at age 35   Other uncles 42   Family History  Problem Relation Age of Onset   Diverticulitis Mother    Stroke Maternal  Grandmother    Hodgkin's lymphoma Paternal Grandfather    Heart disease Other        had CABG   No Known Allergies  Patient Care Team: Birdie Sons, MD as PCP - General (Family Medicine)   Medications: Outpatient Medications Prior to Visit  Medication Sig   amLODipine (NORVASC) 10 MG tablet TAKE 1 TABLET BY MOUTH EVERY DAY   fexofenadine (ALLEGRA ALLERGY) 180 MG tablet Take 1 tablet (180 mg total) by mouth daily.   ibuprofen (ADVIL,MOTRIN) 200 MG tablet Take 200 mg by mouth every 6 (six) hours as needed.   omeprazole (PRILOSEC) 20 MG capsule omeprazole 20 mg capsule,delayed release   valsartan (DIOVAN) 40 MG tablet TAKE 1 TABLET BY MOUTH EVERY DAY   No facility-administered medications  prior to visit.    Review of Systems  Constitutional: Negative.   HENT:  Positive for tinnitus.   Eyes: Negative.   Respiratory: Negative.    Cardiovascular: Negative.   Gastrointestinal: Negative.   Endocrine: Negative.   Genitourinary: Negative.   Musculoskeletal: Negative.   Skin: Negative.   Allergic/Immunologic: Negative.   Neurological: Negative.   Hematological: Negative.   Psychiatric/Behavioral: Negative.        Objective    BP (!) 129/91 (BP Location: Left Arm, Patient Position: Sitting, Cuff Size: Normal)   Pulse 65   Temp 97.9 F (36.6 C) (Oral)   Ht 6' (1.829 m)   Wt 203 lb (92.1 kg)   SpO2 99%   BMI 27.53 kg/m  Vitals:   07/05/22 0904 07/05/22 0906  BP: (!) 131/94 (!) 129/91  Pulse: 65   Temp: 97.9 F (36.6 C)   TempSrc: Oral   SpO2: 99%   Weight: 203 lb (92.1 kg)   Height: 6' (1.829 m)     Physical Exam   General Appearance:    Well developed, well nourished male. Alert, cooperative, in no acute distress, appears stated age  Head:    Normocephalic, without obvious abnormality, atraumatic  Eyes:    PERRL, conjunctiva/corneas clear, EOM's intact, fundi    benign, both eyes       Ears:    Normal TM's and external ear canals, both ears  Nose:   Nares normal, septum midline, mucosa normal, no drainage   or sinus tenderness  Throat:   Lips, mucosa, and tongue normal; teeth and gums normal  Neck:   Supple, symmetrical, trachea midline, no adenopathy;       thyroid:  No enlargement/tenderness/nodules; no carotid   bruit or JVD  Back:     Symmetric, no curvature, ROM normal, no CVA tenderness  Lungs:     Clear to auscultation bilaterally, respirations unlabored  Chest wall:    No tenderness or deformity  Heart:    Normal heart rate. Normal rhythm. No murmurs, rubs, or gallops.  S1 and S2 normal  Abdomen:     Soft, non-tender, bowel sounds active all four quadrants,    no masses, no organomegaly  Genitalia:    deferred  Rectal:    deferred   Extremities:   All extremities are intact. No cyanosis or edema  Pulses:   2+ and symmetric all extremities  Skin:   Skin color, texture, turgor normal, no rashes or lesions  Lymph nodes:   Cervical, supraclavicular, and axillary nodes normal  Neurologic:   CNII-XII intact. Normal strength, sensation and reflexes      throughout     Last depression screening scores    07/05/2022  9:05 AM 06/30/2021    9:14 AM 03/10/2021    8:32 AM  PHQ 2/9 Scores  PHQ - 2 Score 0 0 0  PHQ- 9 Score 0 0 2   Last fall risk screening    07/05/2022    9:05 AM  [place] in the past year? 0  Number falls in past yr: 0  Injury with Fall? 0   Last Audit-C alcohol use screening    07/05/2022    9:05 AM  Alcohol Use Disorder Test (AUDIT)  1. How often do you have a drink containing alcohol? 0  2. How many drinks containing alcohol do you have on a typical day when you are drinking? 0  3. How often do you have six or more drinks on one occasion? 0  AUDIT-C Score 0   A score of 3 or more in women, and 4 or more in men indicates increased risk for alcohol abuse, EXCEPT if all of the points are from question 1   No results found for any visits on 07/05/22.  Assessment & Plan    Routine Health Maintenance and Physical Exam  Exercise Activities and Dietary recommendations  Goals   None     Immunization History  Administered Date(s) Administered   Moderna Sars-Covid-2 Vaccination 02/28/2020, 03/27/2020   Tdap 02/21/2007, 06/30/2021   Zoster Recombinat (Shingrix) 02/11/2022    Health Maintenance  Topic Date Due   HIV Screening  Never done   COVID-19 Vaccine (3 - Moderna risk series) 04/24/2020   Zoster Vaccines- Shingrix (2 of 2) 04/08/2022   INFLUENZA VACCINE  08/21/2022 (Originally 12/21/2021)   COLONOSCOPY (Pts 45-36yr Insurance coverage will need to be confirmed)  06/29/2027   DTaP/Tdap/Td (3 - Td or Tdap) 07/01/2031   Hepatitis C Screening  Completed   HPV VACCINES  Aged Out     Discussed health benefits of physical activity, and encouraged him to engage in regular exercise appropriate for his age and condition.   - Comprehensive metabolic panel - Lipid Panel With LDL/HDL Ratio  2. Primary hypertension Fairly well controlled. Continue current medications.   - TSH - Magnesium - CBC  3. Gastroesophageal reflux disease, unspecified whether esophagitis present Well controlled on current PPI  4. Tachycardia Intermittent and subjective. Normal EKG today. If labs normal will order Zio monitor.    5. Prostate cancer screening  - PSA     The entirety of the information documented in the History of Present Illness, Review of Systems and Physical Exam were personally obtained by me. Portions of this information were initially documented by the CMA and reviewed by me for thoroughness and accuracy.     DLelon Huh MD  [phone](phone) [phone](fax)  [place]

## 2022-07-05 NOTE — Patient Instructions (Addendum)
Please review the attached list of medications and notify my office if there are any errors.   Please contact your eyecare professional to schedule a routine eye exam. I recommend seeing Dr. Jomarie Longs at [phone] or the [facility] at [phone]  If your labs are normal we'll place an order for a ZIO home cardiac monitor

## 2022-07-06 LAB — COMPREHENSIVE METABOLIC PANEL
ALT: 22 IU/L (ref 0–44)
AST: 21 IU/L (ref 0–40)
Albumin/Globulin Ratio: 2.3 — ABNORMAL HIGH (ref 1.2–2.2)
Albumin: 4.5 g/dL (ref 3.8–4.9)
Alkaline Phosphatase: 100 IU/L (ref 44–121)
BUN/Creatinine Ratio: 15 (ref 9–20)
BUN: 18 mg/dL (ref 6–24)
Bilirubin Total: 0.5 mg/dL (ref 0.0–1.2)
CO2: 22 mmol/L (ref 20–29)
Calcium: 9.2 mg/dL (ref 8.7–10.2)
Chloride: 104 mmol/L (ref 96–106)
Creatinine, Ser: 1.19 mg/dL (ref 0.76–1.27)
Globulin, Total: 2 g/dL (ref 1.5–4.5)
Glucose: 94 mg/dL (ref 70–99)
Potassium: 4.8 mmol/L (ref 3.5–5.2)
Sodium: 140 mmol/L (ref 134–144)
Total Protein: 6.5 g/dL (ref 6.0–8.5)
eGFR: 72 mL/min/{1.73_m2} (ref >59)

## 2022-07-06 LAB — LIPID PANEL WITH LDL/HDL RATIO
Cholesterol, Total: 172 mg/dL (ref 100–199)
HDL: 40 mg/dL (ref >39)
LDL Chol Calc (NIH): 114 mg/dL — ABNORMAL HIGH (ref 0–99)
LDL/HDL Ratio: 2.9 ratio (ref 0.0–3.6)
Triglycerides: 95 mg/dL (ref 0–149)
VLDL Cholesterol Cal: 18 mg/dL (ref 5–40)

## 2022-07-06 LAB — CBC
Hematocrit: 44.9 % (ref 37.5–51.0)
Hemoglobin: 15.6 g/dL (ref 13.0–17.7)
MCH: 29.3 pg (ref 26.6–33.0)
MCHC: 34.7 g/dL (ref 31.5–35.7)
MCV: 84 fL (ref 79–97)
Platelets: 207 10*3/uL (ref 150–450)
RBC: 5.33 x10E6/uL (ref 4.14–5.80)
RDW: 13.1 % (ref 11.6–15.4)
WBC: 5.5 10*3/uL (ref 3.4–10.8)

## 2022-07-06 LAB — MAGNESIUM: Magnesium: 2 mg/dL (ref 1.6–2.3)

## 2022-07-06 LAB — TSH: TSH: 4.19 u[IU]/mL (ref 0.450–4.500)

## 2022-07-06 LAB — PSA: Prostate Specific Ag, Serum: 0.4 ng/mL (ref 0.0–4.0)

## 2022-07-06 NOTE — Addendum Note (Signed)
Addended by: Birdie Sons on: 07/06/2022 07:59 AM   Modules accepted: Orders

## 2022-07-14 NOTE — Anesthesia Postprocedure Evaluation (Signed)
Anesthesia Post Note  Patient: Scott Obrien  Procedure(s) Performed: COLONOSCOPY WITH PROPOFOL  Patient location during evaluation: Endoscopy Anesthesia Type: General Level of consciousness: awake and alert Pain management: pain level controlled Vital Signs Assessment: post-procedure vital signs reviewed and stable Respiratory status: spontaneous breathing, nonlabored ventilation, respiratory function stable and patient connected to nasal cannula oxygen Cardiovascular status: blood pressure returned to baseline and stable Postop Assessment: no apparent nausea or vomiting Anesthetic complications: no   No notable events documented.   Last Vitals:  Vitals:   06/28/22 0805 06/28/22 0825  BP:  (!) 133/96  Pulse:    Resp:    Temp: (!) 36 C   SpO2:      Last Pain:  Vitals:   06/28/22 0825  TempSrc:   PainSc: 0-No pain                 Martha Clan

## 2022-07-28 ENCOUNTER — Encounter: Payer: Self-pay | Admitting: Family Medicine

## 2022-07-28 ENCOUNTER — Other Ambulatory Visit: Payer: Self-pay | Admitting: Family Medicine

## 2022-07-28 DIAGNOSIS — I10 Essential (primary) hypertension: Secondary | ICD-10-CM

## 2022-07-28 MED ORDER — AMLODIPINE BESYLATE 10 MG PO TABS: 10.0000 mg | ORAL_TABLET | Freq: Every day | ORAL | 4 refills | Status: DC

## 2022-07-28 NOTE — Telephone Encounter (Signed)
Unable to refill per protocol, Rx request is too soon. Last refill 07/26/21 for 90 and 4 refills.  Requested Prescriptions  Pending Prescriptions Disp Refills   amLODipine (NORVASC) 10 MG tablet [Pharmacy Med Name: AMLODIPINE BESYLATE 10 MG TAB] 30 tablet 14    Sig: TAKE 1 TABLET BY MOUTH EVERY DAY     Cardiovascular: Calcium Channel Blockers 2 Failed - 07/28/2022  1:56 AM      Failed - Last BP in normal range    BP Readings from Last 1 Encounters:  07/05/22 (!) 129/91         Passed - Last Heart Rate in normal range    Pulse Readings from Last 1 Encounters:  07/05/22 65         Passed - Valid encounter within last 6 months    Recent Outpatient Visits           3 weeks ago Annual physical exam   [facility] Birdie Sons, MD   5 months ago Essential hypertension   [place], Donald E, MD   1 year ago Colon cancer screening   [place] Birdie Sons, MD   1 year ago Vasomotor rhinitis   [place], Donald E, MD   1 year ago Cough   [place] Vigg, Avanti, MD       Future Appointments             In 1 week Fisher, Kirstie Peri, MD [facility]

## 2022-07-28 NOTE — Telephone Encounter (Signed)
Amlodipine refilled.   Have the heart monitor results come in?

## 2022-08-05 ENCOUNTER — Ambulatory Visit (INDEPENDENT_AMBULATORY_CARE_PROVIDER_SITE_OTHER): Payer: 59 | Admitting: Family Medicine

## 2022-08-05 DIAGNOSIS — Z23 Encounter for immunization: Secondary | ICD-10-CM | POA: Diagnosis not present

## 2022-08-05 NOTE — Progress Notes (Signed)
Patient administed shingrix injection in left deltoid using 25G 1" needle.

## 2022-12-09 ENCOUNTER — Ambulatory Visit: Payer: Self-pay | Admitting: *Deleted

## 2022-12-09 NOTE — Telephone Encounter (Signed)
  Chief Complaint: swelling left testicle Symptoms: left testicle swelling. Denies pain now but has been mild to moderate pain at times. Hx hernia repair 2002-2003 time frame.  Frequency: couple of days Pertinent Negatives: Patient denies chest pain no fever, no swelling in leg, no abdominal pain, no difficulty passing urine  Disposition: [] ED /[] Urgent Care (no appt availability in office) / [x] Appointment(In office/virtual)/ []  [place] [facility]/ [] Home Care/ [] Refused Recommended Disposition /[] [place] [facility]/ []  Follow-up with PCP Additional Notes:   Appt scheduled same day for 12/12/22 with PCP.    Reason for Disposition  [1] Scrotum swelling AND [2] no pain  Answer Assessment - Initial Assessment Questions 1. SCROTAL SWELLING: "What does the scrotum look like?" "How swollen is it?" (mild, moderate severe; compare to other side)     Swelling left testicle  2. LOCATION: "Where is the swelling located?"     Left testicle  3. ONSET: "When did the swelling start?"     Couple of days  4. PATTERN: "Does it come and go, or has it been constant since it started?"     Constant swelling  5. SCROTAL PAIN: "Is there any pain?" If Yes, ask: "How bad is it?"  (Scale 1-10; or mild, moderate, severe)     Mild moderate pain at times denies now   6. HERNIA: "Has a doctor ever told you that you have a hernia?"     Hx hernia s/p hernia repair  2002 or 2003 7. OTHER SYMPTOMS: "Do you have any other symptoms?" (e.g., abdomen pain, difficulty passing urine, fever, vomiting)     No  Protocols used: Scrotum Swelling-A-AH

## 2022-12-12 ENCOUNTER — Encounter: Payer: Self-pay | Admitting: Family Medicine

## 2022-12-12 ENCOUNTER — Ambulatory Visit: Payer: 59 | Admitting: Family Medicine

## 2022-12-12 VITALS — BP 116/84 | HR 61 | Temp 98.3°F | Resp 12 | Ht 72.0 in | Wt 200.0 lb

## 2022-12-12 DIAGNOSIS — N50819 Testicular pain, unspecified: Secondary | ICD-10-CM

## 2022-12-12 MED ORDER — CIPROFLOXACIN HCL 500 MG PO TABS: 500.0000 mg | ORAL_TABLET | Freq: Two times a day (BID) | ORAL | 0 refills | Status: AC

## 2022-12-12 NOTE — Progress Notes (Signed)
      Established patient visit   Patient: Scott Obrien   DOB: [birth year]   57 y.o. Male  MRN: [id number] Visit Date: 12/12/2022  Today's healthcare provider: Mila Merry, MD   Chief Complaint  Patient presents with   Groin Swelling    Patient C/O swelling left testicle. He denies pain now but has been mild to moderate pain at times. Hx hernia repair 2002-2003. Patient denies any painful urination, burning, or blood in urine.    Subjective    Discussed the use of AI scribe software for clinical note transcription with the patient, who gave verbal consent to proceed.  History of Present Illness   The patient, with a history of a hernia twenty years ago, presents with a two-week history of discomfort and swelling in the left testicular region. The discomfort was initially mild and non-specific, but later evolved into noticeable swelling and tenderness, which peaked about a week ago. The patient describes the swelling as being located behind the left testicle, and notes that the discomfort and swelling have improved but not completely resolved. The patient denies any symptoms on the right side. The patient also mentions a history of a mastectomy, and recalls experiencing some swelling postoperatively, but it is unclear if this is related to the current issue. The patient works as a Therapist, occupational and has been working in hot conditions, which he speculates may have contributed to the symptoms. The patient denies any allergies to antibiotics.       Medications: Outpatient Medications Prior to Visit  Medication Sig   amLODipine (NORVASC) 10 MG tablet Take 1 tablet (10 mg total) by mouth daily.   fexofenadine (ALLEGRA ALLERGY) 180 MG tablet Take 1 tablet (180 mg total) by mouth daily.   ibuprofen (ADVIL,MOTRIN) 200 MG tablet Take 200 mg by mouth every 6 (six) hours as needed.   omeprazole (PRILOSEC) 20 MG capsule omeprazole 20 mg capsule,delayed release   valsartan (DIOVAN) 40 MG tablet  TAKE 1 TABLET BY MOUTH EVERY DAY   No facility-administered medications prior to visit.      Objective    BP 116/84 (BP Location: Left Arm, Patient Position: Sitting, Cuff Size: Large)   Pulse 61   Temp 98.3 F (36.8 C) (Temporal)   Resp 12   Ht 6' (1.829 m)   Wt 200 lb (90.7 kg)   SpO2 99%   BMI 27.12 kg/m   Physical Exam   GENITOURINARY: No hernia detected upon cough test. Left epididymis exhibits slight swelling and tenderness.    No results found for any visits on 12/12/22.  Assessment & Plan     Assessment and Plan    Left Testicular Swelling: Swelling and tenderness in the left testicle for approximately two weeks and much improved over the last week. No hernia detected on examination. Swelling located in the epididymis, possibly due to inflammation or infection. -Observe for the next 5-6 days. -If swelling worsens, fill the provided prescription for ciprofloxacin. -If swelling continues to improve, no need for antibiotics.          Mila Merry, MD  [facility] [phone] (phone) [phone] (fax)  [facility]

## 2023-04-02 ENCOUNTER — Other Ambulatory Visit: Payer: Self-pay | Admitting: Family Medicine

## 2023-04-03 NOTE — Telephone Encounter (Signed)
Requested Prescriptions  Pending Prescriptions Disp Refills   valsartan (DIOVAN) 40 MG tablet [Pharmacy Med Name: VALSARTAN 40 MG TABLET] 90 tablet 0    Sig: TAKE 1 TABLET BY MOUTH EVERY DAY     Cardiovascular:  Angiotensin Receptor Blockers Failed - 04/02/2023  8:46 AM      Failed - Cr in normal range and within 180 days    Creatinine, Ser  Date Value Ref Range Status  07/05/2022 1.19 0.76 - 1.27 mg/dL Final         Failed - K in normal range and within 180 days    Potassium  Date Value Ref Range Status  07/05/2022 4.8 3.5 - 5.2 mmol/L Final         Failed - Valid encounter within last 6 months    Recent Outpatient Visits           3 months ago Epididymal pain   [place] [facility] Malva Limes, MD   8 months ago Need for shingles vaccine   [facility] Malva Limes, MD   9 months ago Annual physical exam   [facility] Malva Limes, MD   1 year ago Essential hypertension   [place] [facility] Malva Limes, MD   1 year ago Colon cancer screening   [facility] Malva Limes, MD              Passed - Patient is not pregnant      Passed - Last BP in normal range    BP Readings from Last 1 Encounters:  12/12/22 116/84

## 2023-07-02 ENCOUNTER — Other Ambulatory Visit: Payer: Self-pay | Admitting: Family Medicine

## 2023-08-04 ENCOUNTER — Other Ambulatory Visit: Payer: Self-pay | Admitting: Family Medicine

## 2023-08-04 DIAGNOSIS — I10 Essential (primary) hypertension: Secondary | ICD-10-CM

## 2023-10-03 ENCOUNTER — Other Ambulatory Visit: Payer: Self-pay | Admitting: Family Medicine

## 2023-10-28 ENCOUNTER — Other Ambulatory Visit: Payer: Self-pay | Admitting: Family Medicine

## 2023-11-30 ENCOUNTER — Encounter: Payer: Self-pay | Admitting: Family Medicine

## 2023-11-30 ENCOUNTER — Other Ambulatory Visit: Payer: Self-pay | Admitting: Family Medicine

## 2023-11-30 MED ORDER — VALSARTAN 40 MG PO TABS: 40.0000 mg | ORAL_TABLET | Freq: Every day | ORAL | 0 refills | Status: DC

## 2024-01-04 ENCOUNTER — Ambulatory Visit: Admitting: Physician Assistant

## 2024-01-04 ENCOUNTER — Ambulatory Visit: Admitting: Family Medicine

## 2024-01-04 ENCOUNTER — Ambulatory Visit: Admitting: Nurse Practitioner

## 2024-01-04 ENCOUNTER — Ambulatory Visit: Attending: Physician Assistant

## 2024-01-04 ENCOUNTER — Other Ambulatory Visit: Payer: Self-pay | Admitting: Family Medicine

## 2024-01-04 VITALS — BP 128/86 | HR 56 | Ht 72.0 in | Wt 194.0 lb

## 2024-01-04 DIAGNOSIS — L918 Other hypertrophic disorders of the skin: Secondary | ICD-10-CM | POA: Insufficient documentation

## 2024-01-04 DIAGNOSIS — R002 Palpitations: Secondary | ICD-10-CM

## 2024-01-04 NOTE — Progress Notes (Signed)
 Established patient visit  Patient: Scott Obrien   DOB: 1966-03-14   58 y.o. Male  MRN: [id number] Visit Date: 01/04/2024  Today's healthcare provider: Jolynn Spencer, PA-C   Chief Complaint  Patient presents with   Mass    Patient is present due to bump on the back of left leg. He reports it feels like one that he had previously on his right leg and would like it checked   Medical Management of Chronic Issues   Hypertension    He reports every blue moon he feels palpitations in his chest and his heart rate is increased but when he checks his bp it is notmal. Reports sometimes he will go weeks without feeling it and then sometimes it might happen 2 days in a row. Episodes may last 2 minutes at he most. Reports he doesn't think he is anxious or stressed.    Subjective     HPI     Mass    Additional comments: Patient is present due to bump on the back of left leg. He reports it feels like one that he had previously on his right leg and would like it checked        Hypertension    Additional comments: He reports every blue moon he feels palpitations in his chest and his heart rate is increased but when he checks his bp it is notmal. Reports sometimes he will go weeks without feeling it and then sometimes it might happen 2 days in a row. Episodes may last 2 minutes at he most. Reports he doesn't think he is anxious or stressed.       Last edited by Lilian Fitzpatrick, CMA on 01/04/2024  9:13 AM.       Discussed the use of AI scribe software for clinical note transcription with the patient, who gave verbal consent to proceed.  History of Present Illness Scott Obrien is a 58 year old male who presents with palpitations and a bump on the back of his left leg.  He experiences palpitations with a sensation of his heart speeding up and a funny feeling in his chest. Episodes last for one to two minutes, occurring sporadically, sometimes with weeks between episodes, and then  on consecutive days. They often occur in the morning around 10 or 11 o'clock and are not linked to specific activities or dietary intake. Blood pressure remains consistent at 140/85 to 90 mmHg during episodes. He is on blood pressure medication. No chest pain, shortness of breath, leg swelling, or visual problems.  A bump on the back of his left leg is present, which is not painful but annoying. A similar bump on his right leg was removed a few years ago with local anesthesia and a snip, followed by a Band-Aid application. He recalls some bleeding at that time but not significant.  He drinks a lot of water , has reduced salt intake over the past two to three years, and is attempting to improve his diet and increase physical activity by walking more.       12/12/2022    4:14 PM 07/05/2022    9:05 AM 06/30/2021    9:14 AM  Depression screen PHQ 2/9  Decreased Interest 0 0 0  Down, Depressed, Hopeless 0 0 0  PHQ - 2 Score 0 0 0  Altered sleeping  0 0  Tired, decreased energy  0 0  Change in appetite  0 0  Feeling bad or failure about yourself  0 0  Trouble concentrating  0 0  Moving slowly or fidgety/restless  0 0  Suicidal thoughts  0 0  PHQ-9 Score  0 0  Difficult doing work/chores  Not difficult at all Not difficult at all       No data to display          Medications: Outpatient Medications Prior to Visit  Medication Sig   amLODipine  (NORVASC ) 10 MG tablet TAKE 1 TABLET BY MOUTH EVERY DAY   fexofenadine  (ALLEGRA  ALLERGY) 180 MG tablet Take 1 tablet (180 mg total) by mouth daily.   ibuprofen (ADVIL,MOTRIN) 200 MG tablet Take 200 mg by mouth every 6 (six) hours as needed.   omeprazole (PRILOSEC) 20 MG capsule omeprazole 20 mg capsule,delayed release   valsartan  (DIOVAN ) 40 MG tablet TAKE 1 TABLET BY MOUTH EVERY DAY   No facility-administered medications prior to visit.    Review of Systems All negative Except see HPI       Objective    BP 128/86   Pulse (!) 56   Ht  6' (1.829 m)   Wt 194 lb (88 kg)   SpO2 98%   BMI 26.31 kg/m     Physical Exam Vitals reviewed.  Constitutional:      General: He is not in acute distress.    Appearance: Normal appearance. He is not diaphoretic.  HENT:     Head: Normocephalic and atraumatic.  Eyes:     General: No scleral icterus.    Conjunctiva/sclera: Conjunctivae normal.  Cardiovascular:     Rate and Rhythm: Normal rate and regular rhythm.     Pulses: Normal pulses.     Heart sounds: Normal heart sounds. No murmur heard. Pulmonary:     Effort: Pulmonary effort is normal. No respiratory distress.     Breath sounds: Normal breath sounds. No wheezing or rhonchi.  Musculoskeletal:     Cervical back: Neck supple.     Right lower leg: No edema.     Left lower leg: No edema.  Lymphadenopathy:     Cervical: No cervical adenopathy.  Skin:    General: Skin is warm and dry.     Findings: No rash.  Neurological:     Mental Status: He is alert and oriented to person, place, and time. Mental status is at baseline.  Psychiatric:        Mood and Affect: Mood normal.        Behavior: Behavior normal.      No results found for any visits on 01/04/24.    Assessment & Plan Palpitations Experiencing sporadic palpitations lasting 1-2 minutes without chest pain or dyspnea. Last episode was yesterday. Blood pressure remains controlled. No significant stress or anxiety reported. Denies sob, chest pain, leg swellings - Monitor and record blood pressure at home during episodes. - Observe and note patterns or triggers. - Discuss potential Zio monitoring with Dr. Gasper at next follow-up. Order for Zio monitor was placed. EKG showed normal sinus rhythm. Will follow-up  Palpitations (Primary)  - CBC with Differential/Platelet - Comprehensive metabolic panel with GFR - TSH - EKG 12-Lead - LONG TERM MONITOR (3-14 DAYS); Future  Skin tag On posterior aspect of the left ?thigh Will reassess at the follow-up  No  orders of the defined types were placed in this encounter.   No follow-ups on file.   The patient was advised to call back or seek an in-person evaluation if the symptoms worsen or if the condition fails to improve  as anticipated.  I discussed the assessment and treatment plan with the patient. The patient was provided an opportunity to ask questions and all were answered. The patient agreed with the plan and demonstrated an understanding of the instructions.  I, Hiroto Saltzman, PA-C have reviewed all documentation for this visit. The documentation on 01/04/2024  for the exam, diagnosis, procedures, and orders are all accurate and complete.  Jolynn Spencer, [facility], MMS [facility] [phone] (phone) [phone] (fax)  [facility]

## 2024-01-05 LAB — COMPREHENSIVE METABOLIC PANEL WITH GFR
ALT: 18 IU/L (ref 0–44)
AST: 26 IU/L (ref 0–40)
Albumin: 4.4 g/dL (ref 3.8–4.9)
Alkaline Phosphatase: 95 IU/L (ref 44–121)
BUN/Creatinine Ratio: 16 (ref 9–20)
BUN: 19 mg/dL (ref 6–24)
Bilirubin Total: 0.8 mg/dL (ref 0.0–1.2)
CO2: 22 mmol/L (ref 20–29)
Calcium: 9.3 mg/dL (ref 8.7–10.2)
Chloride: 102 mmol/L (ref 96–106)
Creatinine, Ser: 1.21 mg/dL (ref 0.76–1.27)
Globulin, Total: 2.2 g/dL (ref 1.5–4.5)
Glucose: 91 mg/dL (ref 70–99)
Potassium: 4.9 mmol/L (ref 3.5–5.2)
Sodium: 138 mmol/L (ref 134–144)
Total Protein: 6.6 g/dL (ref 6.0–8.5)
eGFR: 69 mL/min/1.73 (ref >59)

## 2024-01-05 LAB — CBC WITH DIFFERENTIAL/PLATELET
Basophils Absolute: 0.1 x10E3/uL (ref 0.0–0.2)
Basos: 1 %
EOS (ABSOLUTE): 0.2 x10E3/uL (ref 0.0–0.4)
Eos: 3 %
Hematocrit: 48.3 % (ref 37.5–51.0)
Hemoglobin: 16.1 g/dL (ref 13.0–17.7)
Immature Grans (Abs): 0 x10E3/uL (ref 0.0–0.1)
Immature Granulocytes: 0 %
Lymphocytes Absolute: 1.8 x10E3/uL (ref 0.7–3.1)
Lymphs: 29 %
MCH: 29.5 pg (ref 26.6–33.0)
MCHC: 33.3 g/dL (ref 31.5–35.7)
MCV: 89 fL (ref 79–97)
Monocytes Absolute: 0.5 x10E3/uL (ref 0.1–0.9)
Monocytes: 8 %
Neutrophils Absolute: 3.5 x10E3/uL (ref 1.4–7.0)
Neutrophils: 59 %
Platelets: 201 x10E3/uL (ref 150–450)
RBC: 5.46 x10E6/uL (ref 4.14–5.80)
RDW: 14.3 % (ref 11.6–15.4)
WBC: 6 x10E3/uL (ref 3.4–10.8)

## 2024-01-05 LAB — TSH: TSH: 3.74 u[IU]/mL (ref 0.450–4.500)

## 2024-01-08 ENCOUNTER — Ambulatory Visit: Payer: Self-pay | Admitting: Physician Assistant

## 2024-01-29 DIAGNOSIS — R002 Palpitations: Secondary | ICD-10-CM | POA: Diagnosis not present

## 2024-01-31 DIAGNOSIS — R002 Palpitations: Secondary | ICD-10-CM

## 2024-02-01 ENCOUNTER — Encounter: Payer: Self-pay | Admitting: Cardiology

## 2024-02-01 ENCOUNTER — Ambulatory Visit: Attending: Cardiology | Admitting: Cardiology

## 2024-02-01 VITALS — BP 130/88 | HR 55 | Ht 72.0 in | Wt 197.4 lb

## 2024-02-01 DIAGNOSIS — I471 Supraventricular tachycardia, unspecified: Secondary | ICD-10-CM | POA: Diagnosis not present

## 2024-02-01 DIAGNOSIS — I10 Essential (primary) hypertension: Secondary | ICD-10-CM

## 2024-02-01 NOTE — Patient Instructions (Signed)
 Medication Instructions:  Your physician recommends that you continue on your current medications as directed. Please refer to the Current Medication list given to you today.   *If you need a refill on your cardiac medications before your next appointment, please call your pharmacy*  Lab Work: No labs ordered today  If you have labs (blood work) drawn today and your tests are completely normal, you will receive your results only by: MyChart Message (if you have MyChart) OR A paper copy in the mail If you have any lab test that is abnormal or we need to change your treatment, we will call you to review the results.  Testing/Procedures: No test ordered today   Follow-Up: At [facility], you and your health needs are our priority.  As part of our continuing mission to provide you with exceptional heart care, our providers are all part of one team.  This team includes your primary Cardiologist (physician) and Advanced Practice Providers or APPs (Physician Assistants and Nurse Practitioners) who all work together to provide you with the care you need, when you need it.  Your next appointment:   4 month(s)  Provider:   You may see Dr. Darliss or one of the following Advanced Practice Providers on your designated Care Team:   Lonni Meager, NP Lesley Maffucci, PA-C Bernardino Bring, PA-C Cadence [place], PA-C Tylene Lunch, NP Barnie Hila, NP    We recommend signing up for the patient portal called MyChart.  Sign up information is provided on this After Visit Summary.  MyChart is used to connect with patients for Virtual Visits (Telemedicine).  Patients are able to view lab/test results, encounter notes, upcoming appointments, etc.  Non-urgent messages can be sent to your provider as well.   To learn more about what you can do with MyChart, go to [email or web address].

## 2024-02-01 NOTE — Progress Notes (Signed)
 Cardiology Office Note:    Date:  02/01/2024   ID:  Scott Obrien, DOB July 15, 1965, MRN [id number]  PCP:  Gasper Nancyann BRAVO, MD   [facility] Providers Cardiologist:  None     Referring MD: Gasper Nancyann BRAVO, MD   Chief Complaint  Patient presents with   Establish Care    Ne pt has been doing well with no complaints of chest pain, chest pressure or SOB, medciation reviewed verbally with patient   Scott Obrien is a 58 y.o. male who is being seen today for the evaluation of SVT at the request of Fisher, Nancyann BRAVO, MD.   History of Present Illness:    Scott Obrien is a 58 y.o. male with a hx of hypertension, GERD who presents with palpitations.  Endorse having palpitations over the past year and a half or so.  Symptoms of palpitations occur sporadically, could be once a month or twice a week, typically lasting 30 minutes and associated with lightheadedness.  Followed up with PCP, cardiac monitor was placed.  Cardiac monitor 01/2024 showed 2 episodes of SVT, longest lasting 2 minutes 55 seconds noted.  He denies drinking caffeinated products, denies smoking, denies any family history of heart disease.  Past Medical History:  Diagnosis Date   GERD (gastroesophageal reflux disease)    Hypertension 10 years ago    Past Surgical History:  Procedure Laterality Date   APPENDECTOMY     COLONOSCOPY WITH PROPOFOL  N/A 03/11/2016   Procedure: COLONOSCOPY WITH PROPOFOL ;  Surgeon: Rogelia Copping, MD;  Location: [facility];  Service: Endoscopy;  Laterality: N/A;   COLONOSCOPY WITH PROPOFOL  N/A 06/28/2022   Procedure: COLONOSCOPY WITH PROPOFOL ;  Surgeon: Copping Rogelia, MD;  Location: [facility];  Service: Endoscopy;  Laterality: N/A;   HERNIA REPAIR  2002   KNEE ARTHROSCOPY Left 01/21/2015   Procedure: ARTHROSCOPY KNEE;  Surgeon: Kayla Pinal, MD;  Location: [facility] OR[facility];  Service: Orthopedics;  Laterality: Left;   POLYPECTOMY  03/11/2016   Procedure: POLYPECTOMY;   Surgeon: Rogelia Copping, MD;  Location: [facility];  Service: Endoscopy;;   UPPER GI ENDOSCOPY  08/10/2011   Dr. Christ, Chronic inactive gastritis. No untestinal metapasia. No H. Pylori. Normal esophagus    Current Medications: Current Meds  Medication Sig   amLODipine  (NORVASC ) 10 MG tablet TAKE 1 TABLET BY MOUTH EVERY DAY   fexofenadine  (ALLEGRA  ALLERGY) 180 MG tablet Take 1 tablet (180 mg total) by mouth daily.   ibuprofen (ADVIL,MOTRIN) 200 MG tablet Take 200 mg by mouth every 6 (six) hours as needed.   omeprazole (PRILOSEC) 20 MG capsule omeprazole 20 mg capsule,delayed release   valsartan  (DIOVAN ) 40 MG tablet TAKE 1 TABLET BY MOUTH EVERY DAY     Allergies:   Patient has no known allergies.   Social History   Socioeconomic History   Marital status: Married    Spouse name: Not on file   Number of children: 4   Years of education: Not on file   Highest education level: Some college, no degree  Occupational History   Occupation: Self employed    Comment: Optometrist (shoes horses)  Tobacco Use   Smoking status: Former   Smokeless tobacco: Never   Tobacco comments:    Smoked occasionally, but quit at age 53  Substance and Sexual Activity   Alcohol use: No    Alcohol/week: 0.0 standard drinks of alcohol   Drug use: No   Sexual activity: Not on file  Other Topics Concern  Not on file  Social History Narrative   Not on file   Social Drivers of Health   Financial Resource Strain: Low Risk  (01/04/2024)   Overall Financial Resource Strain (CARDIA)    Difficulty of Paying Living Expenses: Not hard at all  Food Insecurity: No Food Insecurity (01/04/2024)   Hunger Vital Sign    Worried About Running Out of Food in the Last Year: Never true    Ran Out of Food in the Last Year: Never true  Transportation Needs: No Transportation Needs (01/04/2024)   PRAPARE - Administrator, Civil Service (Medical): No    Lack of Transportation (Non-Medical): No  Physical  Activity: Insufficiently Active (01/04/2024)   Exercise Vital Sign    Days of Exercise per Week: 3 days    Minutes of Exercise per Session: 10 min  Stress: No Stress Concern Present (01/04/2024)   [employer] of Occupational Health - Occupational Stress Questionnaire    Feeling of Stress: Only a little  Social Connections: Socially Integrated (01/04/2024)   Social Connection and Isolation Panel    Frequency of Communication with Friends and Family: More than three times a week    Frequency of Social Gatherings with Friends and Family: Once a week    Attends Religious Services: More than 4 times per year    Active Member of [employer] or Organizations: Yes    Attends Engineer, structural: More than 4 times per year    Marital Status: Married     Family History: The patient's family history includes Diverticulitis in his mother; Heart disease in his father and another family member; Hodgkin's lymphoma in his paternal grandfather; Hypertension in his maternal aunt and maternal uncle; Stroke in his maternal grandmother.  ROS:   Please see the history of present illness.     All other systems reviewed and are negative.  EKGs/Labs/Other Studies Reviewed:    The following studies were reviewed today:  EKG Interpretation Date/Time:  Thursday February 01 2024 08:28:38 EDT Ventricular Rate:  55 PR Interval:  148 QRS Duration:  78 QT Interval:  426 QTC Calculation: 407 R Axis:   3  Text Interpretation: Sinus bradycardia Confirmed by Darliss Rogue ([id number]) on 02/01/2024 8:42:24 AM    Recent Labs: 01/04/2024: ALT 18; BUN 19; Creatinine, Ser 1.21; Hemoglobin 16.1; Platelets 201; Potassium 4.9; Sodium 138; TSH 3.740  Recent Lipid Panel    Component Value Date/Time   CHOL 172 07/05/2022 1001   TRIG 95 07/05/2022 1001   HDL 40 07/05/2022 1001   CHOLHDL 4.8 03/10/2021 0913   LDLCALC 114 (H) 07/05/2022 1001     Risk Assessment/Calculations:             Physical Exam:     VS:  BP 130/88 (BP Location: Left Arm, Patient Position: Sitting, Cuff Size: Normal)   Pulse (!) 55   Ht 6' (1.829 m)   Wt 197 lb 6.4 oz (89.5 kg)   SpO2 96%   BMI 26.77 kg/m     Wt Readings from Last 3 Encounters:  02/01/24 197 lb 6.4 oz (89.5 kg)  01/04/24 194 lb (88 kg)  12/12/22 200 lb (90.7 kg)     GEN:  Well nourished, well developed in no acute distress HEENT: Normal NECK: No JVD; No carotid bruits CARDIAC: RRR, no murmurs, rubs, gallops RESPIRATORY:  Clear to auscultation without rales, wheezing or rhonchi  ABDOMEN: Soft, non-tender, non-distended MUSCULOSKELETAL:  No edema; No deformity  SKIN: Warm and dr  NEUROLOGIC:  Alert and oriented x 3 PSYCHIATRIC:  Normal affect   ASSESSMENT:    1. SVT (supraventricular tachycardia) (HCC)   2. Primary hypertension    PLAN:    In order of problems listed above:  2 episodes of SVT noted on cardiac monitor, longest lasting 2 minutes 55 seconds.  Obtain echocardiogram.  Refer to EP.  Baseline bradycardia with heart rate 55 preventing AV nodal agent at this time. Hypertension, BP controlled.  Continue Norvasc  10 mg daily, valsartan  40 mg daily.  Follow-up after echo     Medication Adjustments/Labs and Tests Ordered: Current medicines are reviewed at length with the patient today.  Concerns regarding medicines are outlined above.  Orders Placed This Encounter  Procedures   Ambulatory referral to Cardiac Electrophysiology   EKG 12-Lead   ECHOCARDIOGRAM COMPLETE   No orders of the defined types were placed in this encounter.   Patient Instructions  Medication Instructions:  Your physician recommends that you continue on your current medications as directed. Please refer to the Current Medication list given to you today.   *If you need a refill on your cardiac medications before your next appointment, please call your pharmacy*  Lab Work: No labs ordered today  If you have labs (blood work) drawn today and your  tests are completely normal, you will receive your results only by: MyChart Message (if you have MyChart) OR A paper copy in the mail If you have any lab test that is abnormal or we need to change your treatment, we will call you to review the results.  Testing/Procedures: No test ordered today   Follow-Up: At [facility], you and your health needs are our priority.  As part of our continuing mission to provide you with exceptional heart care, our providers are all part of one team.  This team includes your primary Cardiologist (physician) and Advanced Practice Providers or APPs (Physician Assistants and Nurse Practitioners) who all work together to provide you with the care you need, when you need it.  Your next appointment:   4 month(s)  Provider:   You may see Dr. Darliss or one of the following Advanced Practice Providers on your designated Care Team:   Lonni Meager, NP Lesley Maffucci, PA-C Bernardino Bring, PA-C Cadence [place], PA-C Tylene Lunch, NP Barnie Hila, NP    We recommend signing up for the patient portal called MyChart.  Sign up information is provided on this After Visit Summary.  MyChart is used to connect with patients for Virtual Visits (Telemedicine).  Patients are able to view lab/test results, encounter notes, upcoming appointments, etc.  Non-urgent messages can be sent to your provider as well.   To learn more about what you can do with MyChart, go to [email or web address].          Signed, Redell Darliss, MD  02/01/2024 9:17 AM    [facility]

## 2024-02-02 ENCOUNTER — Ambulatory Visit (INDEPENDENT_AMBULATORY_CARE_PROVIDER_SITE_OTHER): Admitting: Family Medicine

## 2024-02-02 ENCOUNTER — Encounter: Payer: Self-pay | Admitting: Family Medicine

## 2024-02-02 VITALS — BP 128/90 | HR 56 | Ht 72.0 in | Wt 197.9 lb

## 2024-02-02 DIAGNOSIS — L918 Other hypertrophic disorders of the skin: Secondary | ICD-10-CM | POA: Diagnosis not present

## 2024-02-02 DIAGNOSIS — I471 Supraventricular tachycardia, unspecified: Secondary | ICD-10-CM | POA: Insufficient documentation
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
## 2024-02-02 NOTE — Patient Instructions (Signed)
 SABRA  Please review the attached list of medications and notify my office if there are any errors.   . Please bring all of your medications to every appointment so we can make sure that our medication list is the same as yours.

## 2024-03-06 ENCOUNTER — Institutional Professional Consult (permissible substitution): Admitting: Cardiology

## 2024-03-12 ENCOUNTER — Ambulatory Visit: Attending: Cardiology | Admitting: Cardiology

## 2024-03-12 ENCOUNTER — Encounter: Payer: Self-pay | Admitting: Cardiology

## 2024-03-12 VITALS — BP 129/77 | HR 79 | Ht 72.0 in | Wt 190.0 lb

## 2024-03-12 DIAGNOSIS — R002 Palpitations: Secondary | ICD-10-CM | POA: Diagnosis not present

## 2024-03-12 DIAGNOSIS — I471 Supraventricular tachycardia, unspecified: Secondary | ICD-10-CM | POA: Diagnosis not present

## 2024-03-12 MED ORDER — DILTIAZEM HCL 30 MG PO TABS: 30.0000 mg | ORAL_TABLET | Freq: Two times a day (BID) | ORAL | 3 refills | Status: AC | PRN

## 2024-03-12 NOTE — Progress Notes (Signed)
" °  Electrophysiology Office Note:   Date:  03/14/2024  ID:  ERVEN RAMSON, DOB 03-29-1966, MRN [id number]  Primary Cardiologist: None Electrophysiologist: Fonda Kitty, MD      History of Present Illness:   Scott Obrien is a 58 y.o. male with h/o HTN, GERD, SVT who is being seen today for evaluation of his SVT.  Discussed the use of AI scribe software for clinical note transcription with the patient, who gave verbal consent to proceed.  History of Present Illness Scott Obrien is a 58 year old male who presents with supraventricular tachycardia (SVT) after wearing a heart monitor. He was referred by Dr. Darliss for evaluation of supraventricular tachycardia and consideration of further management options.  He experiences episodes of sudden heart rate increase, described as feeling his heart 'speed up,' occurring randomly and accompanied by mild lightheadedness. These symptoms led to the use of a heart monitor.  The heart monitor, worn in August, recorded episodes of SVT with the longest lasting approximately two minutes and fifty-five seconds. During these episodes, his heart rate reached around 170 beats per minute before abruptly returning to normal.  He is not currently on any medication to manage these episodes. His resting heart rate is around 55 beats per minute.  An echocardiogram is scheduled for November 11th to rule out any structural heart issues.  No new or acute complaints today.   Review of systems complete and found to be negative unless listed in HPI.   EP Information / Studies Reviewed:    EKG is not ordered today. EKG from 02/01/24 reviewed which showed sinus bradycardia, PR 148 ms and QRS 78 ms.      Zio 01/11/24:    Physical Exam:   VS:  BP 129/77   Pulse 79   Ht 6' (1.829 m)   Wt 190 lb (86.2 kg)   SpO2 95%   BMI 25.77 kg/m    Wt Readings from Last 3 Encounters:  03/12/24 190 lb (86.2 kg)  02/02/24 197 lb 14.4 oz (89.8 kg)   02/01/24 197 lb 6.4 oz (89.5 kg)     GEN: Well nourished, well developed in no acute distress NECK: No JVD CARDIAC: Normal rate, regular rhythm RESPIRATORY:  Clear to auscultation without rales, wheezing or rhonchi  ABDOMEN: Soft, non-distended EXTREMITIES:  No edema; No deformity   ASSESSMENT AND PLAN:    #. SVT: Symptomatic and sustained.  #. Palpitations - Patient already has an echocardiogram scheduled for 04/02/24.  We discussed options for SVT management, including EP study and catheter ablation versus medical management.  Patient has resting bradycardia, which limits use of rate controlling /AV nodal blocking medications.  He could likely take flecainide for suppression, but need results of echocardiogram first to ensure LVEF is normal. We will have him return after echocardiogram has been completed to finalize plan for either catheter ablation versus medical management.  He would be an appropriate candidate for EP study and catheter ablation should he choose to do so.  If he chooses to pursue medical management and LVEF is normal on echo, then we will obtain a nuclear stress test to rule out presence of ischemia or scar prior to starting flecainide. - Start diltiazem  30 mg as needed for sustained episodes.  Avoiding long-acting medication due to resting bradycardia.  Follow up with EP APP in 4 weeks  Signed, Fonda Kitty, MD  "

## 2024-03-12 NOTE — Patient Instructions (Signed)
 Medication Instructions:  Your physician has recommended you make the following change in your medication:  1) START taking Cardizem (diltiazem) 30 mg twice daily as needed  *If you need a refill on your cardiac medications before your next appointment, please call your pharmacy*  Follow-Up: At [facility], you and your health needs are our priority.  As part of our continuing mission to provide you with exceptional heart care, our providers are all part of one team.  This team includes your primary Cardiologist (physician) and Advanced Practice Providers or APPs (Physician Assistants and Nurse Practitioners) who all work together to provide you with the care you need, when you need it.  Your next appointment:   1 week after echocardiogram  Provider:   Suzann Riddle, NP   Ablation Your physician has recommended that you have an ablation. Catheter ablation is a medical procedure used to treat some cardiac arrhythmias (irregular heartbeats). During catheter ablation, a long, thin, flexible tube is put into a blood vessel in your groin (upper thigh), or neck. This tube is called an ablation catheter. It is then guided to your heart through the blood vessel. Radio frequency waves destroy small areas of heart tissue where abnormal heartbeats may cause an arrhythmia to start. If you would like to proceed with an ablation please give us  a call.

## 2024-03-14 ENCOUNTER — Other Ambulatory Visit

## 2024-04-02 ENCOUNTER — Ambulatory Visit: Attending: Cardiology

## 2024-04-02 DIAGNOSIS — I471 Supraventricular tachycardia, unspecified: Secondary | ICD-10-CM

## 2024-04-02 LAB — ECHOCARDIOGRAM COMPLETE
AR max vel: 3.03 cm2
AV Area VTI: 3.08 cm2
AV Area mean vel: 2.87 cm2
AV Mean grad: 5 mmHg
AV Peak grad: 9.4 mmHg
Ao pk vel: 1.54 m/s
Area-P 1/2: 4.1 cm2
MV M vel: 1.44 m/s
MV Peak grad: 8.3 mmHg
S' Lateral: 3.2 cm

## 2024-04-04 ENCOUNTER — Ambulatory Visit: Payer: Self-pay | Admitting: Cardiology

## 2024-04-06 ENCOUNTER — Other Ambulatory Visit: Payer: Self-pay | Admitting: Family Medicine

## 2024-04-10 NOTE — Progress Notes (Unsigned)
 Electrophysiology Clinic Note    Date:  04/11/2024  Patient ID:  Scott Obrien, Scott Obrien 11-12-1965, MRN [id number] PCP:  Gasper Nancyann BRAVO, MD  Cardiologist:  Redell Cave, MD  Electrophysiologist:  Fonda Kitty, MD  Electrophysiology APP:  Konor Noren, NP     Discussed the use of AI scribe software for clinical note transcription with the patient, who gave verbal consent to proceed.   Patient Profile    Chief Complaint: SVT follow-up  History of Present Illness: Scott Obrien is a 58 y.o. male with PMH notable for SVT, HTN; seen today for Fonda Kitty, MD for routine electrophysiology followup.   He last saw Dr. Kitty 02/2024 for initial EP evaluation of SVT.  SVT diagnosed on a heart monitor with episodes lasting up to~3 minutes.  Echo was planned but not completed prior to appointment.  Treatment of SVT limited by bradycardia.  Dr. Kitty started 30 mg diltiazem  as needed to use during episodes.  Since last being seen in our clinic the patient reports doing very well. He has not had any SVT episodes since last seeing Dr. Kitty. He estimates his longest SVT episode was about 5 minutes and occurred iso covid diagnosis. He has not needed any PRN dilt.       Arrhythmia/Device History No specialty comments available.    ROS:  Please see the history of present illness. All other systems are reviewed and otherwise negative.    Physical Exam    VS:  BP 122/78 (BP Location: Left Arm, Patient Position: Sitting, Cuff Size: Normal)   Ht 6' (1.829 m)   Wt 202 lb (91.6 kg)   SpO2 97%   BMI 27.40 kg/m  BMI: Body mass index is 27.4 kg/m.           Wt Readings from Last 3 Encounters:  04/11/24 202 lb (91.6 kg)  03/12/24 190 lb (86.2 kg)  02/02/24 197 lb 14.4 oz (89.8 kg)     GEN- The patient is well appearing, alert and oriented x 3 today.   Lungs- Clear to ausculation bilaterally, normal work of breathing.  Heart- Regular rate and rhythm, no murmurs,  rubs or gallops Extremities- No peripheral edema, warm, dry   Studies Reviewed   Previous EP, cardiology notes.    EKG is not ordered. Personal review of EKG from 02/01/2024 shows:  SB at 55        TTE, 04/02/2024  1. Left ventricular ejection fraction, by estimation, is 60 to 65%. The left ventricle has normal function. Left ventricular endocardial border not optimally defined to evaluate regional wall motion. Left ventricular diastolic parameters were normal.   2. Right ventricular systolic function is normal. The right ventricular size is normal.   3. The mitral valve is myxomatous. Mild mitral valve regurgitation. No evidence of mitral stenosis. There is mild prolapse of posterior leaflet of the mitral valve.   Comparison(s): No prior Echocardiogram.   Conclusion(s)/Recommendation(s): Otherwise normal echocardiogram, with minor abnormalities described in the report.   Long term monitor, 01/30/2024 HR 44 to 185, average 77. 2 nonsustained SVT, longest 2 min 55 seconds.  Rare supraventricular and ventricular ectopy. No atrial fibrillation.   Assessment and Plan     #) SVT #) palpitations Recent TTE with normal LVEF, no significant structural abnormalities We discussed bradycardia limiting up-titration of medications He is not inclined to pursue EPS at this time, which I think is reasonable Will continue PRN dilt during sustained episodes Continue to monitor  burden, and notify office if burden increases       Current medicines are reviewed at length with the patient today.   The patient does not have concerns regarding his medicines.  The following changes were made today:  none  Labs/ tests ordered today include:  No orders of the defined types were placed in this encounter.    Disposition: Follow up with Dr. Kennyth or EP APP in 12 months   Signed, Chantal Needle, NP  04/11/24  9:19 AM  Electrophysiology [facility]

## 2024-04-11 ENCOUNTER — Ambulatory Visit: Attending: Cardiology | Admitting: Cardiology

## 2024-04-11 ENCOUNTER — Encounter: Payer: Self-pay | Admitting: Cardiology

## 2024-04-11 VITALS — BP 122/78 | Ht 72.0 in | Wt 202.0 lb

## 2024-04-11 DIAGNOSIS — R002 Palpitations: Secondary | ICD-10-CM | POA: Diagnosis not present

## 2024-04-11 DIAGNOSIS — I471 Supraventricular tachycardia, unspecified: Secondary | ICD-10-CM | POA: Diagnosis not present

## 2024-04-11 DIAGNOSIS — R001 Bradycardia, unspecified: Secondary | ICD-10-CM | POA: Diagnosis not present

## 2024-04-11 NOTE — Patient Instructions (Signed)
 Medication Instructions:  Your physician recommends that you continue on your current medications as directed. Please refer to the Current Medication list given to you today.  *If you need a refill on your cardiac medications before your next appointment, please call your pharmacy*  Lab Work: No labs ordered today  If you have labs (blood work) drawn today and your tests are completely normal, you will receive your results only by: MyChart Message (if you have MyChart) OR A paper copy in the mail If you have any lab test that is abnormal or we need to change your treatment, we will call you to review the results.  Testing/Procedures: No test ordered today   Follow-Up: At [facility], you and your health needs are our priority.  As part of our continuing mission to provide you with exceptional heart care, our providers are all part of one team.  This team includes your primary Cardiologist (physician) and Advanced Practice Providers or APPs (Physician Assistants and Nurse Practitioners) who all work together to provide you with the care you need, when you need it.  Your next appointment:   12 month(s)  Provider:  Suzann Riddle, NP  We recommend signing up for the patient portal called MyChart.  Sign up information is provided on this After Visit Summary.  MyChart is used to connect with patients for Virtual Visits (Telemedicine).  Patients are able to view lab/test results, encounter notes, upcoming appointments, etc.  Non-urgent messages can be sent to your provider as well.   To learn more about what you can do with MyChart, go to [email or web address].

## 2024-06-03 ENCOUNTER — Ambulatory Visit: Admitting: Cardiology
